# Patient Record
Sex: Female | Born: 1985 | Race: White | Hispanic: No | Marital: Married | Smoking: Former smoker
Health system: Southern US, Community
[De-identification: ages and names within clinical notes are randomized; demographics above are authoritative.]

## PROBLEM LIST (undated history)

## (undated) DIAGNOSIS — G43909 Migraine, unspecified, not intractable, without status migrainosus: Secondary | ICD-10-CM

## (undated) DIAGNOSIS — O009 Unspecified ectopic pregnancy without intrauterine pregnancy: Secondary | ICD-10-CM

## (undated) DIAGNOSIS — G9050 Complex regional pain syndrome I, unspecified: Secondary | ICD-10-CM

## (undated) DIAGNOSIS — E282 Polycystic ovarian syndrome: Secondary | ICD-10-CM

## (undated) DIAGNOSIS — I10 Essential (primary) hypertension: Secondary | ICD-10-CM

## (undated) DIAGNOSIS — R112 Nausea with vomiting, unspecified: Secondary | ICD-10-CM

## (undated) DIAGNOSIS — I7300 Raynaud's syndrome without gangrene: Secondary | ICD-10-CM

## (undated) DIAGNOSIS — Z9889 Other specified postprocedural states: Secondary | ICD-10-CM

## (undated) DIAGNOSIS — J189 Pneumonia, unspecified organism: Secondary | ICD-10-CM

## (undated) DIAGNOSIS — T8859XA Other complications of anesthesia, initial encounter: Secondary | ICD-10-CM

## (undated) DIAGNOSIS — M797 Fibromyalgia: Secondary | ICD-10-CM

## (undated) DIAGNOSIS — Z889 Allergy status to unspecified drugs, medicaments and biological substances status: Secondary | ICD-10-CM

## (undated) DIAGNOSIS — Z9189 Other specified personal risk factors, not elsewhere classified: Secondary | ICD-10-CM

## (undated) DIAGNOSIS — F419 Anxiety disorder, unspecified: Secondary | ICD-10-CM

## (undated) DIAGNOSIS — I499 Cardiac arrhythmia, unspecified: Secondary | ICD-10-CM

## (undated) DIAGNOSIS — J45909 Unspecified asthma, uncomplicated: Secondary | ICD-10-CM

## (undated) DIAGNOSIS — J449 Chronic obstructive pulmonary disease, unspecified: Secondary | ICD-10-CM

## (undated) HISTORY — PX: MEDIAL COLLATERAL LIGAMENT REPAIR, KNEE: SHX2019

## (undated) HISTORY — PX: ECTOPIC PREGNANCY SURGERY: SHX613

## (undated) HISTORY — PX: TONSILLECTOMY: SUR1361

## (undated) HISTORY — PX: CHOLECYSTECTOMY: SHX55

---

## 2009-08-12 ENCOUNTER — Emergency Department (HOSPITAL_BASED_OUTPATIENT_CLINIC_OR_DEPARTMENT_OTHER): Admission: EM | Admit: 2009-08-12 | Discharge: 2009-08-12 | Payer: Self-pay | Admitting: Emergency Medicine

## 2009-08-12 ENCOUNTER — Ambulatory Visit: Payer: Self-pay | Admitting: Diagnostic Radiology

## 2010-12-16 LAB — DIFFERENTIAL
Basophils Relative: 1 % (ref 0–1)
Eosinophils Relative: 2 % (ref 0–5)
Lymphocytes Relative: 16 % (ref 12–46)
Monocytes Absolute: 0.7 10*3/uL (ref 0.1–1.0)
Monocytes Relative: 6 % (ref 3–12)
Neutrophils Relative %: 76 % (ref 43–77)

## 2010-12-16 LAB — URINALYSIS, ROUTINE W REFLEX MICROSCOPIC
Glucose, UA: NEGATIVE mg/dL
Nitrite: NEGATIVE
Specific Gravity, Urine: 1.004 — ABNORMAL LOW (ref 1.005–1.030)
pH: 6 (ref 5.0–8.0)

## 2010-12-16 LAB — PREGNANCY, URINE: Preg Test, Ur: POSITIVE

## 2010-12-16 LAB — CBC
MCV: 87.9 fL (ref 78.0–100.0)
Platelets: 228 10*3/uL (ref 150–400)
WBC: 11 10*3/uL — ABNORMAL HIGH (ref 4.0–10.5)

## 2010-12-16 LAB — BASIC METABOLIC PANEL
BUN: 8 mg/dL (ref 6–23)
Creatinine, Ser: 0.7 mg/dL (ref 0.4–1.2)
GFR calc non Af Amer: >60 mL/min (ref >60)

## 2010-12-16 LAB — URINE MICROSCOPIC-ADD ON

## 2013-05-07 ENCOUNTER — Other Ambulatory Visit: Payer: Self-pay | Admitting: Physician Assistant

## 2013-05-08 LAB — OTHER SOLSTAS TEST
ALT: 19 U/L (ref 0–35)
HCV Ab: NEGATIVE
HIV: NONREACTIVE
Hepatitis B Surface Ag: NEGATIVE
Hepatitis B-Post: 90.6 m[IU]/mL

## 2016-11-26 ENCOUNTER — Encounter (HOSPITAL_BASED_OUTPATIENT_CLINIC_OR_DEPARTMENT_OTHER): Payer: Self-pay | Admitting: Emergency Medicine

## 2016-11-26 ENCOUNTER — Emergency Department (HOSPITAL_BASED_OUTPATIENT_CLINIC_OR_DEPARTMENT_OTHER)
Admission: EM | Admit: 2016-11-26 | Discharge: 2016-11-26 | Disposition: A | Discharge status: Home / Self Care | Payer: BLUE CROSS/BLUE SHIELD | Attending: Emergency Medicine | Admitting: Emergency Medicine

## 2016-11-26 DIAGNOSIS — Z7982 Long term (current) use of aspirin: Secondary | ICD-10-CM | POA: Insufficient documentation

## 2016-11-26 DIAGNOSIS — T7840XA Allergy, unspecified, initial encounter: Secondary | ICD-10-CM | POA: Diagnosis not present

## 2016-11-26 DIAGNOSIS — Z79899 Other long term (current) drug therapy: Secondary | ICD-10-CM | POA: Insufficient documentation

## 2016-11-26 DIAGNOSIS — F17210 Nicotine dependence, cigarettes, uncomplicated: Secondary | ICD-10-CM | POA: Diagnosis not present

## 2016-11-26 DIAGNOSIS — R21 Rash and other nonspecific skin eruption: Secondary | ICD-10-CM | POA: Diagnosis present

## 2016-11-26 HISTORY — DX: Unspecified ectopic pregnancy without intrauterine pregnancy: O00.90

## 2016-11-26 HISTORY — DX: Fibromyalgia: M79.7

## 2016-11-26 HISTORY — DX: Allergy status to unspecified drugs, medicaments and biological substances: Z88.9

## 2016-11-26 HISTORY — DX: Anxiety disorder, unspecified: F41.9

## 2016-11-26 HISTORY — DX: Other specified personal risk factors, not elsewhere classified: Z91.89

## 2016-11-26 HISTORY — DX: Migraine, unspecified, not intractable, without status migrainosus: G43.909

## 2016-11-26 HISTORY — DX: Polycystic ovarian syndrome: E28.2

## 2016-11-26 HISTORY — DX: Raynaud's syndrome without gangrene: I73.00

## 2016-11-26 MED ORDER — DIPHENHYDRAMINE HCL 25 MG PO CAPS: 50.0000 mg | ORAL_CAPSULE | Freq: Four times a day (QID) | ORAL | 0 refills | Status: DC

## 2016-11-26 MED ORDER — DIPHENHYDRAMINE HCL 25 MG PO CAPS
25.0000 mg | ORAL_CAPSULE | Freq: Once | ORAL | Status: AC
  Administered 2016-11-26: 25 mg via ORAL
  Filled 2016-11-26: qty 1

## 2016-11-26 MED ORDER — FAMOTIDINE 40 MG PO TABS: 40.0000 mg | ORAL_TABLET | Freq: Two times a day (BID) | ORAL | 0 refills | Status: DC

## 2016-11-26 MED ORDER — FAMOTIDINE 20 MG PO TABS
40.0000 mg | ORAL_TABLET | Freq: Once | ORAL | Status: AC
  Administered 2016-11-26: 40 mg via ORAL
  Filled 2016-11-26: qty 2

## 2016-11-26 NOTE — ED Notes (Signed)
Pt on cardiac monitor and auto VS 

## 2016-11-26 NOTE — ED Notes (Signed)
Confirmed with EDP re: Benadryl since pt had it PTA; EDP wants to proceed with Benadryl.

## 2016-11-26 NOTE — ED Notes (Signed)
Pt states feeling "some better" , normal speech noted, no dysphagia noted, tracheal sounds clear

## 2016-11-26 NOTE — ED Triage Notes (Signed)
Patient with hx of multiple drug allergies, started Buspar yesterday. Patient reports sudden onset hives to chest and flushing to face today. Patient was given two chewable benadryl PTA, declined transport with EMS. Patient is speaking without distress at this time.

## 2016-11-26 NOTE — ED Notes (Signed)
Pt sts she feels about the same; face is noted to be less bright red and discoloration on forehead diminished.

## 2016-11-26 NOTE — ED Provider Notes (Signed)
[facility]   CSN: [id number] Arrival date & time: 11/26/16  1523     History   Chief Complaint Chief Complaint  Patient presents with  . Allergic Reaction    HPI Joanna Zimmerman is a 31 y.o. female.  The history is provided by the patient.  Rash   This is a new problem. The current episode started 1 to 2 hours ago. The problem has not changed since onset.Associated with: new medicaiton, fast food. There has been no fever. The rash is present on the face. The pain is mild. The pain has been constant since onset. Associated symptoms include itching. She has tried antihistamines for the symptoms. The treatment provided no relief.    Past Medical History:  Diagnosis Date  . Anxiety   . Ectopic pregnancy   . Fibromyalgia   . H/O multiple allergies   . Migraines   . PCOS (polycystic ovarian syndrome)   . Raynaud disease     There are no active problems to display for this patient.   Past Surgical History:  Procedure Laterality Date  . CHOLECYSTECTOMY      OB History    No data available       Home Medications    Prior to Admission medications   Medication Sig Start Date End Date Taking? Authorizing Provider  aspirin EC 81 MG tablet Take 81 mg by mouth daily.   Yes Historical Provider, MD  atenolol (TENORMIN) 50 MG tablet Take 50 mg by mouth daily.   Yes Historical Provider, MD  busPIRone (BUSPAR) 15 MG tablet Take 15 mg by mouth 3 (three) times daily.   Yes Historical Provider, MD  cetirizine (ZYRTEC) 10 MG tablet Take 10 mg by mouth daily.   Yes Historical Provider, MD  cyclobenzaprine (FLEXERIL) 10 MG tablet Take 10 mg by mouth 3 (three) times daily as needed for muscle spasms.   Yes Historical Provider, MD  dicyclomine (BENTYL) 10 MG capsule Take 10 mg by mouth 4 (four) times daily -  before meals and at bedtime.   Yes Historical Provider, MD  DULoxetine (CYMBALTA) 30 MG capsule Take 30 mg by mouth daily.   Yes Historical Provider, MD    EPINEPHrine 0.3 mg/0.3 mL IJ SOAJ injection Inject into the muscle once.   Yes Historical Provider, MD  etonogestrel-ethinyl estradiol (NUVARING) 0.12-0.015 MG/24HR vaginal ring Place 1 each vaginally every 28 (twenty-eight) days. Insert vaginally and leave in place for 3 consecutive weeks, then remove for 1 week.   Yes Historical Provider, MD  fluticasone (FLONASE) 50 MCG/ACT nasal spray Place into both nostrils daily.   Yes Historical Provider, MD  gabapentin (NEURONTIN) 800 MG tablet Take 800 mg by mouth 3 (three) times daily.   Yes Historical Provider, MD  hydrocortisone cream 0.5 % Apply 1 application topically 2 (two) times daily.   Yes Historical Provider, MD  Ibuprofen-Famotidine (DUEXIS) 800-26.6 MG TABS Take by mouth.   Yes Historical Provider, MD  lidocaine (LIDODERM) 5 % Place 1 patch onto the skin daily. Remove & Discard patch within 12 hours or as directed by MD   Yes Historical Provider, MD  omeprazole (PRILOSEC) 40 MG capsule Take 40 mg by mouth daily.   Yes Historical Provider, MD  promethazine (PHENERGAN) 25 MG tablet Take 25 mg by mouth every 6 (six) hours as needed for nausea or vomiting.   Yes Historical Provider, MD  vitamin B-12 (CYANOCOBALAMIN) 1000 MCG tablet Take 1,000 mcg by mouth daily.   Yes  Historical Provider, MD  Botulinum Toxin Type A (BOTOX) 200 units SOLR Inject as directed.    Historical Provider, MD  ketoconazole (NIZORAL) 2 % cream Apply 1 application topically daily.    Historical Provider, MD  ondansetron (ZOFRAN) 4 MG tablet Take 4 mg by mouth every 8 (eight) hours as needed for nausea or vomiting.    Historical Provider, MD  promethazine (PHENERGAN) 25 MG suppository Place 25 mg rectally every 6 (six) hours as needed for nausea or vomiting.    Historical Provider, MD    Family History History reviewed. No pertinent family history.  Social History Social History  Substance Use Topics  . Smoking status: Current Every Day Smoker    Types: E-cigarettes   . Smokeless tobacco: Never Used  . Alcohol use No     Allergies   Cefdinir; Baclofen; Doxycycline; Levetiracetam; Levofloxacin; Nifedipine; Relafen [nabumetone]; Robaxin [methocarbamol]; and Venlafaxine   Review of Systems Review of Systems  Skin: Positive for itching and rash.  All other systems reviewed and are negative.    Physical Exam Updated Vital Signs BP 128/66 (BP Location: Right Arm)   Pulse 92 Comment: NSR  Temp 98.3 F (36.8 C) (Oral)   Resp (!) 22   Ht 5\' 7"  (1.702 m)   Wt 245 lb (111.1 kg)   SpO2 98%   BMI 38.37 kg/m   Physical Exam  Constitutional: She is oriented to person, place, and time. She appears well-developed and well-nourished. No distress.  HENT:  Head: Normocephalic.  Nose: Nose normal.  Mouth/Throat: Oropharynx is clear and moist.  Airway widely patent  Eyes: Conjunctivae are normal.  Neck: Neck supple. No tracheal deviation present.  Cardiovascular: Normal rate, regular rhythm and normal heart sounds.   Pulmonary/Chest: Effort normal and breath sounds normal. No respiratory distress.  Abdominal: Soft. She exhibits no distension.  Neurological: She is alert and oriented to person, place, and time.  Skin: Skin is warm and dry.  Mild facial flushing  Psychiatric: She has a normal mood and affect.     ED Treatments / Results  Labs (all labs ordered are listed, but only abnormal results are displayed) Labs Reviewed - No data to display  EKG  EKG Interpretation None       Radiology No results found.  Procedures Procedures (including critical care time)  Medications Ordered in ED Medications  diphenhydrAMINE (BENADRYL) capsule 25 mg (25 mg Oral Given 11/26/16 1617)  famotidine (PEPCID) tablet 40 mg (40 mg Oral Given 11/26/16 1618)     Initial Impression / Assessment and Plan / ED Course  I have reviewed the triage vital signs and the nursing notes.  Pertinent labs & imaging results that were available during my care of  the patient were reviewed by me and considered in my medical decision making (see chart for details).     31 y.o. female presents with facial flushing. Recently started buspar which she blames for her symptoms but they started after getting [employer]. Unclear trigger but appears to be mild allergic reaction. No signs of anaphylaxis. H1 and H2 blockers provided. No clinical progression. Plan to follow up with PCP as needed and return precautions discussed for worsening or new concerning symptoms.   Final Clinical Impressions(s) / ED Diagnoses   Final diagnoses:  Allergic reaction, initial encounter    New Prescriptions New Prescriptions   No medications on file     Lyndal Pulleyaniel Roann Merk, MD 11/27/16 0221

## 2017-08-25 ENCOUNTER — Emergency Department (HOSPITAL_BASED_OUTPATIENT_CLINIC_OR_DEPARTMENT_OTHER): Payer: Self-pay

## 2017-08-25 ENCOUNTER — Other Ambulatory Visit: Payer: Self-pay

## 2017-08-25 ENCOUNTER — Emergency Department (HOSPITAL_BASED_OUTPATIENT_CLINIC_OR_DEPARTMENT_OTHER)
Admission: EM | Admit: 2017-08-25 | Discharge: 2017-08-25 | Disposition: A | Discharge status: Home / Self Care | Payer: Self-pay | Attending: Physician Assistant | Admitting: Physician Assistant

## 2017-08-25 ENCOUNTER — Encounter (HOSPITAL_BASED_OUTPATIENT_CLINIC_OR_DEPARTMENT_OTHER): Payer: Self-pay | Admitting: *Deleted

## 2017-08-25 DIAGNOSIS — F17290 Nicotine dependence, other tobacco product, uncomplicated: Secondary | ICD-10-CM | POA: Insufficient documentation

## 2017-08-25 DIAGNOSIS — Z7982 Long term (current) use of aspirin: Secondary | ICD-10-CM | POA: Insufficient documentation

## 2017-08-25 DIAGNOSIS — H9201 Otalgia, right ear: Secondary | ICD-10-CM | POA: Insufficient documentation

## 2017-08-25 DIAGNOSIS — M791 Myalgia, unspecified site: Secondary | ICD-10-CM | POA: Insufficient documentation

## 2017-08-25 DIAGNOSIS — R079 Chest pain, unspecified: Secondary | ICD-10-CM | POA: Insufficient documentation

## 2017-08-25 DIAGNOSIS — J069 Acute upper respiratory infection, unspecified: Secondary | ICD-10-CM | POA: Insufficient documentation

## 2017-08-25 DIAGNOSIS — R0602 Shortness of breath: Secondary | ICD-10-CM | POA: Insufficient documentation

## 2017-08-25 DIAGNOSIS — Z79899 Other long term (current) drug therapy: Secondary | ICD-10-CM | POA: Insufficient documentation

## 2017-08-25 MED ORDER — BENZONATATE 100 MG PO CAPS: 100.0000 mg | ORAL_CAPSULE | Freq: Three times a day (TID) | ORAL | 0 refills | Status: DC

## 2017-08-25 MED ORDER — ALBUTEROL SULFATE HFA 108 (90 BASE) MCG/ACT IN AERS
1.0000 | INHALATION_SPRAY | Freq: Once | RESPIRATORY_TRACT | Status: AC
  Administered 2017-08-25: 1 via RESPIRATORY_TRACT
  Filled 2017-08-25: qty 6.7

## 2017-08-25 MED ORDER — PREDNISONE 10 MG PO TABS: 40.0000 mg | ORAL_TABLET | Freq: Every day | ORAL | 0 refills | Status: AC

## 2017-08-25 MED ORDER — AEROCHAMBER PLUS FLO-VU MEDIUM MISC
1.0000 | Freq: Once | Status: AC
  Administered 2017-08-25: 1
  Filled 2017-08-25: qty 1

## 2017-08-25 MED ORDER — FLUTICASONE PROPIONATE 50 MCG/ACT NA SUSP: 1.0000 | Freq: Every day | NASAL | 0 refills | Status: DC

## 2017-08-25 NOTE — Discharge Instructions (Signed)
You likely have a viral illness.  This should be treated symptomatically. Use Tylenol or ibuprofen as needed for fevers or body aches. Use Flonase daily for nasal congestion and cough. Take prednisone as prescribed. Use the albuterol inhaler as needed for shortness of breath and cough.  Use tessalon pereles or cough drops for cough.  Make sure you stay well-hydrated with water. Wash your hands frequently to prevent spread of infection. Follow-up with your primary care doctor in 1 week if your symptoms are not improving. Return to the emergency room if you develop chest pain, difficulty breathing, or any new or worsening symptoms.

## 2017-08-25 NOTE — ED Triage Notes (Signed)
Dry cough x 3 days. Today she feels like she is not getting a good breath. No distress at triage. Normal breathing and speaking in complete sentences.

## 2017-08-25 NOTE — ED Provider Notes (Signed)
[facility] EMERGENCY DEPARTMENT Provider Note   CSN: [id number] Arrival date & time: 08/25/17  1225     History   Chief Complaint Chief Complaint  Patient presents with  . Shortness of Breath    HPI Joanna Zimmerman is a 31 y.o. female presenting with cough and shortness of breath.  Patient states that starting on Monday, she started to feel poorly.  Tuesday, she developed a dry cough and some shortness of breath.  This has persisted throughout today.  Cough is constant, not worse at night.  Shortness of breath is worse with activity.  She reports chest pain with coughing.  She reports generalized body aches and right ear pain.  She denies fevers, reporting chills.  She denies sore throat, nausea, vomiting, abdominal pain, urinary symptoms, abnormal bowel movements.  She denies recent surgeries, travel, immobilization, history of blood clots, or history of cancer.  She states she is on oral contraceptives.  She denies leg pain or swelling.  HPI  Past Medical History:  Diagnosis Date  . Anxiety   . Ectopic pregnancy   . Fibromyalgia   . H/O multiple allergies   . Migraines   . PCOS (polycystic ovarian syndrome)   . Raynaud disease     There are no active problems to display for this patient.   Past Surgical History:  Procedure Laterality Date  . CHOLECYSTECTOMY      OB History    No data available       Home Medications    Prior to Admission medications   Medication Sig Start Date End Date Taking? Authorizing Provider  aspirin EC 81 MG tablet Take 81 mg by mouth daily.    [provider]  atenolol (TENORMIN) 50 MG tablet Take 50 mg by mouth daily.    [provider]  benzonatate (TESSALON) 100 MG capsule Take 1 capsule (100 mg total) by mouth every 8 (eight) hours. 08/25/17   Jara Feider, PA-C  Botulinum Toxin Type A (BOTOX) 200 units SOLR Inject as directed.    [provider]  busPIRone (BUSPAR) 15 MG tablet  Take 15 mg by mouth 3 (three) times daily.    [provider]  cetirizine (ZYRTEC) 10 MG tablet Take 10 mg by mouth daily.    [provider]  cyclobenzaprine (FLEXERIL) 10 MG tablet Take 10 mg by mouth 3 (three) times daily as needed for muscle spasms.    [provider]  dicyclomine (BENTYL) 10 MG capsule Take 10 mg by mouth 4 (four) times daily -  before meals and at bedtime.    [provider]  diphenhydrAMINE (BENADRYL) 25 mg capsule Take 2 capsules (50 mg total) by mouth every 6 (six) hours. 11/26/16 11/30/16  Lyndal PulleyKnott, Daniel, MD  DULoxetine (CYMBALTA) 30 MG capsule Take 30 mg by mouth daily.    [provider]  EPINEPHrine 0.3 mg/0.3 mL IJ SOAJ injection Inject into the muscle once.    [provider]  etonogestrel-ethinyl estradiol (NUVARING) 0.12-0.015 MG/24HR vaginal ring Place 1 each vaginally every 28 (twenty-eight) days. Insert vaginally and leave in place for 3 consecutive weeks, then remove for 1 week.    [provider]  famotidine (PEPCID) 40 MG tablet Take 1 tablet (40 mg total) by mouth 2 (two) times daily. 11/26/16 11/30/16  Lyndal PulleyKnott, Daniel, MD  fluticasone (FLONASE) 50 MCG/ACT nasal spray Place 1 spray into both nostrils daily. 08/25/17   Maniya Donovan, PA-C  gabapentin (NEURONTIN) 800 MG tablet Take 800  mg by mouth 3 (three) times daily.    [provider]  hydrocortisone cream 0.5 % Apply 1 application topically 2 (two) times daily.    [provider]  Ibuprofen-Famotidine (DUEXIS) 800-26.6 MG TABS Take by mouth.    [provider]  ketoconazole (NIZORAL) 2 % cream Apply 1 application topically daily.    [provider]  lidocaine (LIDODERM) 5 % Place 1 patch onto the skin daily. Remove & Discard patch within 12 hours or as directed by MD    [provider]  omeprazole (PRILOSEC) 40 MG capsule Take 40 mg by mouth daily.    [provider]  ondansetron (ZOFRAN) 4 MG  tablet Take 4 mg by mouth every 8 (eight) hours as needed for nausea or vomiting.    [provider]  predniSONE (DELTASONE) 10 MG tablet Take 4 tablets (40 mg total) by mouth daily for 5 days. 08/25/17 08/30/17  Stepfon Rawles, PA-C  promethazine (PHENERGAN) 25 MG suppository Place 25 mg rectally every 6 (six) hours as needed for nausea or vomiting.    [provider]  promethazine (PHENERGAN) 25 MG tablet Take 25 mg by mouth every 6 (six) hours as needed for nausea or vomiting.    [provider]  vitamin B-12 (CYANOCOBALAMIN) 1000 MCG tablet Take 1,000 mcg by mouth daily.    [provider]    Family History No family history on file.  Social History Social History   Tobacco Use  . Smoking status: Current Every Day Smoker    Types: E-cigarettes  . Smokeless tobacco: Never Used  Substance Use Topics  . Alcohol use: No  . Drug use: No     Allergies   Cefdinir; Baclofen; Doxycycline; Levetiracetam; Levofloxacin; Nifedipine; Relafen [nabumetone]; Robaxin [methocarbamol]; and Venlafaxine   Review of Systems Review of Systems  Constitutional: Positive for chills. Negative for fever.  HENT: Negative for congestion and sore throat.   Respiratory: Positive for cough and shortness of breath.      Physical Exam Updated Vital Signs BP 139/81   Pulse 80   Temp 98.1 F (36.7 C) (Oral)   Resp 18   Ht 5\' 7"  (1.702 m)   Wt 99.8 kg (220 lb)   SpO2 99%   BMI 34.46 kg/m   Physical Exam  Constitutional: She is oriented to person, place, and time. She appears well-developed and well-nourished. No distress.  HENT:  Head: Normocephalic and atraumatic.  Right Ear: Tympanic membrane, external ear and ear canal normal.  Left Ear: Tympanic membrane, external ear and ear canal normal.  Nose: Mucosal edema present. Right sinus exhibits no maxillary sinus tenderness and no frontal sinus tenderness. Left sinus exhibits no maxillary sinus tenderness and  no frontal sinus tenderness.  Mouth/Throat: Uvula is midline, oropharynx is clear and moist and mucous membranes are normal. No tonsillar exudate.  Nasal mucosal edema.  OP clear without tonsillar swelling or exudate. No TTP of the sinuses  Eyes: Conjunctivae and EOM are normal. Pupils are equal, round, and reactive to light.  Neck: Normal range of motion.  Cardiovascular: Normal rate, regular rhythm and intact distal pulses.  Pulmonary/Chest: Effort normal and breath sounds normal. She has no decreased breath sounds. She has no wheezes. She has no rhonchi. She has no rales. She exhibits tenderness.  Pt speaking in full sentences without difficulty. Clear lung sounds in all fields. TTP of the anterior chest wall.   Abdominal: Soft. She exhibits no distension. There is no tenderness.  Musculoskeletal: Normal range of motion.  No leg pain or swelling. Pedal pulses intact bilaterally.  Lymphadenopathy:    She has no cervical adenopathy.  Neurological: She is alert and oriented to person, place, and time.  Skin: Skin is warm.  Psychiatric: She has a normal mood and affect.  Nursing note and vitals reviewed.    ED Treatments / Results  Labs (all labs ordered are listed, but only abnormal results are displayed) Labs Reviewed - No data to display  EKG  EKG Interpretation None       Radiology Dg Chest 2 View  Result Date: 08/25/2017 CLINICAL DATA:  Shortness of breath, cough EXAM: CHEST  2 VIEW COMPARISON:  Thoracic spine films of 03/03/2016 FINDINGS: No pneumonia or effusion is seen. Mediastinal and hilar contours are unremarkable. The heart is within normal limits in size with probable mild pectus present. No acute bony abnormality is seen. IMPRESSION: No active cardiopulmonary disease. question mild pectus deformity. Electronically Signed   By: Dwyane Dee M.D.   On: 08/25/2017 12:56    Procedures Procedures (including critical care time)  Medications Ordered in ED Medications    albuterol (PROVENTIL HFA;VENTOLIN HFA) 108 (90 Base) MCG/ACT inhaler 1 puff (1 puff Inhalation Given 08/25/17 1345)  AEROCHAMBER PLUS FLO-VU MEDIUM MISC 1 each (1 each Other Given 08/25/17 1345)     Initial Impression / Assessment and Plan / ED Course  I have reviewed the triage vital signs and the nursing notes.  Pertinent labs & imaging results that were available during my care of the patient were reviewed by me and considered in my medical decision making (see chart for details).     Patient presenting with URI symptoms.  Physical exam reassuring, patient is afebrile and appears nontoxic.  Pulmonary exam reassuring.  Doubt pneumonia, strep, other bacterial infection, or peritonsillar abscess. Doubt PE, Wells score indicates low risk. CXR negative for infection. Likely URI.  Will treat symptomatically.  Patient to follow-up with primary care as needed.  At this time, patient appears safe for discharge.  Return precautions given.  Patient states she understands and agrees to plan.    Final Clinical Impressions(s) / ED Diagnoses   Final diagnoses:  Upper respiratory tract infection, unspecified type    ED Discharge Orders        Ordered    predniSONE (DELTASONE) 10 MG tablet  Daily     08/25/17 1338    fluticasone (FLONASE) 50 MCG/ACT nasal spray  Daily     08/25/17 1338    benzonatate (TESSALON) 100 MG capsule  Every 8 hours     08/25/17 1338       Vinh Sachs, PA-C 08/25/17 1719    Mackuen, Cindee Salt, MD 08/29/17 0002

## 2018-06-01 ENCOUNTER — Emergency Department (INDEPENDENT_AMBULATORY_CARE_PROVIDER_SITE_OTHER): Payer: BC Managed Care – PPO

## 2018-06-01 ENCOUNTER — Encounter: Payer: Self-pay | Admitting: *Deleted

## 2018-06-01 ENCOUNTER — Emergency Department (INDEPENDENT_AMBULATORY_CARE_PROVIDER_SITE_OTHER)
Admission: EM | Admit: 2018-06-01 | Discharge: 2018-06-01 | Disposition: A | Discharge status: Home / Self Care | Payer: BC Managed Care – PPO | Source: Home / Self Care | Attending: Family Medicine | Admitting: Family Medicine

## 2018-06-01 DIAGNOSIS — M5412 Radiculopathy, cervical region: Secondary | ICD-10-CM

## 2018-06-01 DIAGNOSIS — M62838 Other muscle spasm: Secondary | ICD-10-CM | POA: Diagnosis not present

## 2018-06-01 MED ORDER — HYDROCODONE-ACETAMINOPHEN 5-325 MG PO TABS: 1.0000 | ORAL_TABLET | Freq: Four times a day (QID) | ORAL | 0 refills | Status: DC | PRN

## 2018-06-01 MED ORDER — PREDNISONE 20 MG PO TABS: ORAL_TABLET | ORAL | 0 refills | Status: DC

## 2018-06-01 MED ORDER — CYCLOBENZAPRINE HCL 10 MG PO TABS: 10.0000 mg | ORAL_TABLET | Freq: Every day | ORAL | 1 refills | Status: DC

## 2018-06-01 NOTE — ED Triage Notes (Signed)
Pt awoke with left sided neck and shoulder pain x1 week ago. Pain has persisted throughout week. Denies injury.

## 2018-06-01 NOTE — Discharge Instructions (Addendum)
Apply ice pack for 20 to 30 minutes, 3 to 4 times daily  Continue until pain and swelling decrease.  Begin range of motion and stretching exercises as tolerated. 

## 2018-06-01 NOTE — ED Provider Notes (Signed)
[facility]    CSN: [id number] Arrival date & time: 06/01/18  0940     History   Chief Complaint Chief Complaint  Patient presents with  . Shoulder Pain    HPI Joanna Zimmerman is a 32 y.o. female.   Patient awoke 6 days ago with pain in her left neck that has persisted. She denies extremity weakness or paresthesias.  The pain radiates to her left scapula.  She recalls no injury.  The history is provided by the patient.    Past Medical History:  Diagnosis Date  . Anxiety   . Ectopic pregnancy   . Fibromyalgia   . H/O multiple allergies   . Migraines   . PCOS (polycystic ovarian syndrome)   . Raynaud disease     There are no active problems to display for this patient.   Past Surgical History:  Procedure Laterality Date  . CHOLECYSTECTOMY    . ECTOPIC PREGNANCY SURGERY      OB History   None      Home Medications    Prior to Admission medications   Medication Sig Start Date End Date Taking? Authorizing Provider  aspirin EC 81 MG tablet Take 81 mg by mouth daily.    [provider]  atenolol (TENORMIN) 50 MG tablet Take 50 mg by mouth daily.    [provider]  benzonatate (TESSALON) 100 MG capsule Take 1 capsule (100 mg total) by mouth every 8 (eight) hours. 08/25/17   Caccavale, Sophia, PA-C  Botulinum Toxin Type A (BOTOX) 200 units SOLR Inject as directed.    [provider]  busPIRone (BUSPAR) 15 MG tablet Take 15 mg by mouth 3 (three) times daily.    [provider]  cetirizine (ZYRTEC) 10 MG tablet Take 10 mg by mouth daily.    [provider]  cyclobenzaprine (FLEXERIL) 10 MG tablet Take 1 tablet (10 mg total) by mouth at bedtime. 06/01/18   Lattie Haw, MD  dicyclomine (BENTYL) 10 MG capsule Take 10 mg by mouth 4 (four) times daily -  before meals and at bedtime.    [provider]  diphenhydrAMINE (BENADRYL) 25 mg capsule Take 2 capsules (50 mg total) by mouth every 6 (six)  hours. 11/26/16 11/30/16  Lyndal Pulley, MD  DULoxetine (CYMBALTA) 30 MG capsule Take 30 mg by mouth daily.    [provider]  EPINEPHrine 0.3 mg/0.3 mL IJ SOAJ injection Inject into the muscle once.    [provider]  etonogestrel-ethinyl estradiol (NUVARING) 0.12-0.015 MG/24HR vaginal ring Place 1 each vaginally every 28 (twenty-eight) days. Insert vaginally and leave in place for 3 consecutive weeks, then remove for 1 week.    [provider]  famotidine (PEPCID) 40 MG tablet Take 1 tablet (40 mg total) by mouth 2 (two) times daily. 11/26/16 11/30/16  Lyndal Pulley, MD  fluticasone (FLONASE) 50 MCG/ACT nasal spray Place 1 spray into both nostrils daily. 08/25/17   Caccavale, Sophia, PA-C  gabapentin (NEURONTIN) 800 MG tablet Take 800 mg by mouth 3 (three) times daily.    [provider]  HYDROcodone-acetaminophen (NORCO/VICODIN) 5-325 MG tablet Take 1 tablet by mouth every 6 (six) hours as needed for moderate pain. 06/01/18   Lattie Haw, MD  hydrocortisone cream 0.5 % Apply 1 application topically 2 (two) times daily.    [provider]  Ibuprofen-Famotidine (DUEXIS) 800-26.6 MG TABS Take by mouth.    [provider]  ketoconazole (NIZORAL) 2 % cream Apply  1 application topically daily.    [provider]  lidocaine (LIDODERM) 5 % Place 1 patch onto the skin daily. Remove & Discard patch within 12 hours or as directed by MD    [provider]  omeprazole (PRILOSEC) 40 MG capsule Take 40 mg by mouth daily.    [provider]  ondansetron (ZOFRAN) 4 MG tablet Take 4 mg by mouth every 8 (eight) hours as needed for nausea or vomiting.    [provider]  predniSONE (DELTASONE) 20 MG tablet Take one tab by mouth twice daily for 4 days, then one daily for 3 days. Take with food. 06/01/18   Lattie Haw, MD  promethazine (PHENERGAN) 25 MG suppository Place 25 mg rectally every 6 (six) hours as needed for nausea  or vomiting.    [provider]  promethazine (PHENERGAN) 25 MG tablet Take 25 mg by mouth every 6 (six) hours as needed for nausea or vomiting.    [provider]  vitamin B-12 (CYANOCOBALAMIN) 1000 MCG tablet Take 1,000 mcg by mouth daily.    [provider]    Family History History reviewed. No pertinent family history.  Social History Social History   Tobacco Use  . Smoking status: Current Every Day Smoker    Types: E-cigarettes  . Smokeless tobacco: Never Used  Substance Use Topics  . Alcohol use: No  . Drug use: No     Allergies   Cefdinir; Penicillins; Baclofen; Doxycycline; Levetiracetam; Levofloxacin; Nifedipine; Relafen [nabumetone]; Robaxin [methocarbamol]; and Venlafaxine   Review of Systems Review of Systems  Constitutional: Negative.   HENT: Negative.   Eyes: Negative.   Respiratory: Negative.   Cardiovascular: Negative.   Gastrointestinal: Negative.   Genitourinary: Negative.   Musculoskeletal: Positive for neck pain.  Skin: Negative.   Neurological: Negative for numbness and headaches.     Physical Exam Triage Vital Signs ED Triage Vitals  Enc Vitals Group     BP 06/01/18 1000 131/84     Pulse Rate 06/01/18 1000 83     Resp 06/01/18 1000 16     Temp 06/01/18 1000 98.3 F (36.8 C)     Temp Source 06/01/18 1000 Oral     SpO2 06/01/18 1000 97 %     Weight --      Height 06/01/18 1001 5\' 7"  (1.702 m)     Head Circumference --      Peak Flow --      Pain Score 06/01/18 1001 4     Pain Loc --      Pain Edu? --      Excl. in GC? --    No data found.  Updated Vital Signs BP 131/84 (BP Location: Right Arm)   Pulse 83   Temp 98.3 F (36.8 C) (Oral)   Resp 16   Ht 5\' 7"  (1.702 m)   SpO2 97%   BMI 34.46 kg/m   Visual Acuity Right Eye Distance:   Left Eye Distance:   Bilateral Distance:    Right Eye Near:   Left Eye Near:    Bilateral Near:     Physical Exam  Constitutional: She appears well-developed  and well-nourished. No distress.  HENT:  Head: Normocephalic.  Right Ear: External ear normal.  Left Ear: External ear normal.  Nose: Nose normal.  Mouth/Throat: Oropharynx is clear and moist.  Eyes: Pupils are equal, round, and reactive to light. Conjunctivae are normal.  Neck: Neck supple. Spinous process tenderness and muscular tenderness  present. No neck rigidity. Decreased range of motion present.    Neck reveal midline posterior tenderness to palpation.  There is tenderness to palpation over the left sternocleidomastoid muscle and trapezius muscle, extending to the left rhomboid muscles.   She has pain with rotation and flexion of her head to the left.   Distal neurovascular function is intact.   Cardiovascular: Normal heart sounds.  Pulmonary/Chest: Breath sounds normal.  Musculoskeletal: She exhibits no edema.       Back:  There is distinct tenderness over medial edge of left scapula.  Pain elicited by resisted abduction of left shoulder while palpating left rhomboid muscles.   Lymphadenopathy:    She has no cervical adenopathy.  Neurological: She is alert.  Skin: Skin is warm and dry.  Nursing note and vitals reviewed.    UC Treatments / Results  Labs (all labs ordered are listed, but only abnormal results are displayed) Labs Reviewed - No data to display  EKG None  Radiology Dg Cervical Spine Complete  Result Date: 06/01/2018 CLINICAL DATA:  Left-sided neck pain radiating to the left scapula, 1 week duration. EXAM: CERVICAL SPINE - COMPLETE 4+ VIEW COMPARISON:  03/03/2016 FINDINGS: Straightening of the normal cervical lordosis. No significant disc space narrowing. No evidence of facet arthropathy. No osteophytic encroachment upon the canal or foramina. IMPRESSION: Straightening of the normal cervical lordosis. Otherwise unremarkable images. No sign of disc space narrowing or osteophytic encroachment upon the canal or foramina. Electronically Signed   By: Paulina FusiMark  Shogry  M.D.   On: 06/01/2018 11:08    Procedures Procedures (including critical care time)  Medications Ordered in UC Medications - No data to display  Initial Impression / Assessment and Plan / UC Course  I have reviewed the triage vital signs and the nursing notes.  Pertinent labs & imaging results that were available during my care of the patient were reviewed by me and considered in my medical decision making (see chart for details).    Note straightening of cervical lordosis on C-spine X-ray. Begin prednisone burst/taper, Flexeril and Lortab (#12, no refill) at bedtime. Controlled Substance Prescriptions I have consulted the [place] Controlled Substances Registry for this patient, and feel the risk/benefit ratio today is favorable for proceeding with this prescription for a controlled substance.  Followup with Dr. Rodney Langtonhomas Thekkekandam or Dr. Clementeen GrahamEvan [facility] ([facility]) if not improving about two weeks.     Final Clinical Impressions(s) / UC Diagnoses   Final diagnoses:  Neck muscle spasm     Discharge Instructions     Apply ice pack for 20 to 30 minutes, 3 to 4 times daily  Continue until pain and swelling decrease. Begin range of motion and stretching exercises as tolerated.    ED Prescriptions    Medication Sig Dispense Auth. Provider   predniSONE (DELTASONE) 20 MG tablet Take one tab by mouth twice daily for 4 days, then one daily for 3 days. Take with food. 11 tablet Lattie HawBeese, Stephen A, MD   cyclobenzaprine (FLEXERIL) 10 MG tablet Take 1 tablet (10 mg total) by mouth at bedtime. 10 tablet Lattie HawBeese, Stephen A, MD   HYDROcodone-acetaminophen (NORCO/VICODIN) 5-325 MG tablet Take 1 tablet by mouth every 6 (six) hours as needed for moderate pain. 12 tablet Lattie HawBeese, Stephen A, MD         Lattie HawBeese, Stephen A, MD 06/01/18 1140

## 2018-06-30 ENCOUNTER — Emergency Department
Admission: EM | Admit: 2018-06-30 | Discharge: 2018-06-30 | Disposition: A | Discharge status: Home / Self Care | Payer: BC Managed Care – PPO | Source: Home / Self Care | Attending: Family Medicine | Admitting: Family Medicine

## 2018-06-30 ENCOUNTER — Other Ambulatory Visit: Payer: Self-pay

## 2018-06-30 ENCOUNTER — Emergency Department (INDEPENDENT_AMBULATORY_CARE_PROVIDER_SITE_OTHER): Payer: BC Managed Care – PPO

## 2018-06-30 DIAGNOSIS — R112 Nausea with vomiting, unspecified: Secondary | ICD-10-CM

## 2018-06-30 DIAGNOSIS — R197 Diarrhea, unspecified: Secondary | ICD-10-CM

## 2018-06-30 DIAGNOSIS — R1031 Right lower quadrant pain: Secondary | ICD-10-CM | POA: Diagnosis not present

## 2018-06-30 DIAGNOSIS — R1030 Lower abdominal pain, unspecified: Secondary | ICD-10-CM | POA: Diagnosis not present

## 2018-06-30 DIAGNOSIS — N83202 Unspecified ovarian cyst, left side: Secondary | ICD-10-CM

## 2018-06-30 DIAGNOSIS — N83201 Unspecified ovarian cyst, right side: Secondary | ICD-10-CM

## 2018-06-30 LAB — POCT URINALYSIS DIP (MANUAL ENTRY)
BILIRUBIN UA: NEGATIVE
BILIRUBIN UA: NEGATIVE mg/dL
Blood, UA: NEGATIVE
GLUCOSE UA: NEGATIVE mg/dL
Leukocytes, UA: NEGATIVE
NITRITE UA: NEGATIVE
SPEC GRAV UA: 1.02 (ref 1.010–1.025)
Urobilinogen, UA: 0.2 E.U./dL
pH, UA: 7 (ref 5.0–8.0)

## 2018-06-30 LAB — POCT CBC W AUTO DIFF (K'VILLE URGENT CARE)

## 2018-06-30 MED ORDER — AZITHROMYCIN 250 MG PO TABS: 250.0000 mg | ORAL_TABLET | Freq: Every day | ORAL | 0 refills | Status: DC

## 2018-06-30 MED ORDER — ONDANSETRON 4 MG PO TBDP
4.0000 mg | ORAL_TABLET | Freq: Once | ORAL | Status: AC
  Administered 2018-06-30: 4 mg via ORAL

## 2018-06-30 MED ORDER — IOPAMIDOL (ISOVUE-300) INJECTION 61%
100.0000 mL | Freq: Once | INTRAVENOUS | Status: AC | PRN
  Administered 2018-06-30: 100 mL via INTRAVENOUS

## 2018-06-30 MED ORDER — PROMETHAZINE HCL 25 MG PO TABS: 25.0000 mg | ORAL_TABLET | Freq: Four times a day (QID) | ORAL | 0 refills | Status: DC | PRN

## 2018-06-30 NOTE — ED Provider Notes (Signed)
[facility]    CSN: [id number] Arrival date & time: 06/30/18  0816     History   Chief Complaint Chief Complaint  Patient presents with  . Abdominal Pain    diarrhea  . Nausea    HPI Joanna Zimmerman is a 32 y.o. female.   HPI Joanna Zimmerman is a 32 y.o. female presenting to UC with c/o diarrhea for the last 2 weeks.  Last night she developed nausea and vomiting, about 6 episodes. Associated lower abdominal pain, worse in the RLQ.  Pt reports "burning" in her abdomen.  Hx of UTIs. Denies fever but has had chills. No dysuria but is concerned about UTIs.  Hx of UTIs in the past. 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 No recent antibiotic use. No hx of diverticulitis.    Past Medical History:  Diagnosis Date  . Anxiety   . Ectopic pregnancy   . Fibromyalgia   . H/O multiple allergies   . Migraines   . PCOS (polycystic ovarian syndrome)   . Raynaud disease     There are no active problems to display for this patient.   Past Surgical History:  Procedure Laterality Date  . CHOLECYSTECTOMY    . ECTOPIC PREGNANCY SURGERY      OB History   None      Home Medications    Prior to Admission medications   Medication Sig Start Date End Date Taking? Authorizing Provider  aspirin EC 81 MG tablet Take 81 mg by mouth daily.    [provider]  atenolol (TENORMIN) 50 MG tablet Take 50 mg by mouth daily.    [provider]  azithromycin (ZITHROMAX) 250 MG tablet Take 1 tablet (250 mg total) by mouth daily. Take first 2 tablets together, then 1 every day until finished. 06/30/18   Lurene Shadow, PA-C  benzonatate (TESSALON) 100 MG capsule Take 1 capsule (100 mg total) by mouth every 8 (eight) hours. 08/25/17   Caccavale, Sophia, PA-C  Botulinum Toxin Type A (BOTOX) 200 units SOLR Inject as directed.    [provider]    busPIRone (BUSPAR) 15 MG tablet Take 15 mg by mouth 3 (three) times daily.    [provider]  cetirizine (ZYRTEC) 10 MG tablet Take 10 mg by mouth daily.    [provider]  cyclobenzaprine (FLEXERIL) 10 MG tablet Take 1 tablet (10 mg total) by mouth at bedtime. 06/01/18   Lattie Haw, MD  dicyclomine (BENTYL) 10 MG capsule Take 10 mg by mouth 4 (four) times daily -  before meals and at bedtime.    [provider]  diphenhydrAMINE (BENADRYL) 25 mg capsule Take 2 capsules (50 mg total) by mouth every 6 (six) hours. 11/26/16 11/30/16  Lyndal Pulley, MD  DULoxetine (CYMBALTA) 30 MG capsule Take 30 mg by mouth daily.    [provider]  EPINEPHrine 0.3 mg/0.3 mL IJ SOAJ injection Inject into the muscle once.    [provider]  etonogestrel-ethinyl estradiol (NUVARING) 0.12-0.015 MG/24HR vaginal ring Place 1 each vaginally every 28 (twenty-eight) days. Insert vaginally and leave in place for 3 consecutive weeks, then remove for 1 week.    [provider]  famotidine (PEPCID) 40 MG tablet Take 1 tablet (40 mg total) by mouth 2 (two) times daily. 11/26/16 11/30/16  Lyndal Pulley, MD  fluticasone (  FLONASE) 50 MCG/ACT nasal spray Place 1 spray into both nostrils daily. 08/25/17   Caccavale, Sophia, PA-C  gabapentin (NEURONTIN) 800 MG tablet Take 800 mg by mouth 3 (three) times daily.    [provider]  HYDROcodone-acetaminophen (NORCO/VICODIN) 5-325 MG tablet Take 1 tablet by mouth every 6 (six) hours as needed for moderate pain. 06/01/18   Lattie Haw, MD  hydrocortisone cream 0.5 % Apply 1 application topically 2 (two) times daily.    [provider]  Ibuprofen-Famotidine (DUEXIS) 800-26.6 MG TABS Take by mouth.    [provider]  ketoconazole (NIZORAL) 2 % cream Apply 1 application topically daily.    [provider]  lidocaine (LIDODERM) 5 % Place 1 patch onto the skin daily. Remove & Discard patch within 12  hours or as directed by MD    [provider]  omeprazole (PRILOSEC) 40 MG capsule Take 40 mg by mouth daily.    [provider]  ondansetron (ZOFRAN) 4 MG tablet Take 4 mg by mouth every 8 (eight) hours as needed for nausea or vomiting.    [provider]  predniSONE (DELTASONE) 20 MG tablet Take one tab by mouth twice daily for 4 days, then one daily for 3 days. Take with food. 06/01/18   Lattie Haw, MD  promethazine (PHENERGAN) 25 MG tablet Take 1 tablet (25 mg total) by mouth every 6 (six) hours as needed. 06/30/18   Lurene Shadow, PA-C  vitamin B-12 (CYANOCOBALAMIN) 1000 MCG tablet Take 1,000 mcg by mouth daily.    [provider]    Family History History reviewed. No pertinent family history.  Social History Social History   Tobacco Use  . Smoking status: Current Every Day Smoker    Types: E-cigarettes  . Smokeless tobacco: Never Used  Substance Use Topics  . Alcohol use: No  . Drug use: No     Allergies   Cefdinir; Penicillins; Baclofen; Doxycycline; Levetiracetam; Levofloxacin; Nifedipine; Relafen [nabumetone]; Robaxin [methocarbamol]; and Venlafaxine   Review of Systems Review of Systems  Constitutional: Positive for appetite change and chills. Negative for fatigue and fever.  Cardiovascular: Negative for chest pain and palpitations.  Gastrointestinal: Positive for abdominal pain, diarrhea, nausea and vomiting. Negative for blood in stool and constipation.  Genitourinary: Negative for dysuria, frequency and hematuria.  Musculoskeletal: Positive for back pain. Negative for myalgias.  Neurological: Negative for dizziness, light-headedness and headaches.     Physical Exam Triage Vital Signs ED Triage Vitals  Enc Vitals Group     BP 06/30/18 0835 138/81     Pulse Rate 06/30/18 0835 94     Resp --      Temp 06/30/18 0835 97.7 F (36.5 C)     Temp Source 06/30/18 0835 Oral     SpO2 06/30/18 0835 97 %     Weight 06/30/18  0836 224 lb (101.6 kg)     Height 06/30/18 0836 5\' 8"  (1.727 m)     Head Circumference --      Peak Flow --      Pain Score 06/30/18 0836 3     Pain Loc --      Pain Edu? --      Excl. in GC? --    No data found.  Updated Vital Signs BP 138/81 (BP Location: Right Arm)   Pulse 94   Temp 97.7 F (36.5 C) (Oral)   Ht 5\' 8"  (1.727 m)   Wt 224 lb (101.6 kg)  LMP 06/20/2018 (Approximate)   SpO2 97%   BMI 34.06 kg/m   Visual Acuity Right Eye Distance:   Left Eye Distance:   Bilateral Distance:    Right Eye Near:   Left Eye Near:    Bilateral Near:     Physical Exam  Constitutional: She is oriented to person, place, and time. She appears well-developed and well-nourished.  Non-toxic appearance. She does not appear ill. No distress.  HENT:  Head: Normocephalic and atraumatic.  Mouth/Throat: Oropharynx is clear and moist.  Eyes: EOM are normal.  Neck: Normal range of motion.  Cardiovascular: Normal rate and regular rhythm.  Pulmonary/Chest: Effort normal and breath sounds normal.  Abdominal: Normal appearance. There is tenderness in the right lower quadrant, suprapubic area and left lower quadrant. There is no CVA tenderness.  Musculoskeletal: Normal range of motion.  Neurological: She is alert and oriented to person, place, and time.  Skin: Skin is warm and dry.  Psychiatric: She has a normal mood and affect. Her behavior is normal.  Nursing note and vitals reviewed.    UC Treatments / Results  Labs (all labs ordered are listed, but only abnormal results are displayed) Labs Reviewed  POCT URINALYSIS DIP (MANUAL ENTRY) - Abnormal; Notable for the following components:      Result Value   Protein Ur, POC trace (*)    All other components within normal limits  COMPLETE METABOLIC PANEL WITH GFR  LIPASE  GASTROINTESTINAL PATHOGEN PANEL PCR  POCT CBC W AUTO DIFF ([facility])    EKG None  Radiology Ct Abdomen Pelvis W Contrast  Result Date:  06/30/2018 CLINICAL DATA:  Right lower quadrant pain with nausea and vomiting EXAM: CT ABDOMEN AND PELVIS WITH CONTRAST TECHNIQUE: Multidetector CT imaging of the abdomen and pelvis was performed using the standard protocol following bolus administration of intravenous contrast. CONTRAST:  ISOVUE-300 IOPAMIDOL (ISOVUE-300) INJECTION 61% COMPARISON:  None. FINDINGS: Lower chest: No acute abnormality. Hepatobiliary: No focal liver abnormality is seen. Status post cholecystectomy. No biliary dilatation. Pancreas: Unremarkable. No pancreatic ductal dilatation or surrounding inflammatory changes. Spleen: Normal in size without focal abnormality. Adrenals/Urinary Tract: Adrenal glands are unremarkable. Kidneys are normal, without renal calculi, focal lesion, or hydronephrosis. Bladder is unremarkable. Stomach/Bowel: Stomach is within normal limits. Appendix appears normal. No evidence of bowel wall thickening, distention, or inflammatory changes. Vascular/Lymphatic: No significant vascular findings are present. No enlarged abdominal or pelvic lymph nodes. Reproductive: Uterus is within normal limits. Bilateral ovarian cystic changes are seen slightly more prominent on the left than the right. The largest of these measures 3.3 cm. Other: No abdominal wall hernia or abnormality. No abdominopelvic ascites. Musculoskeletal: No acute or significant osseous findings. IMPRESSION: Ovarian cystic changes without evidence of cyst rupture. No other focal abnormality is noted. The appendix is within normal limits. Electronically Signed   By: Alcide Clever M.D.   On: 06/30/2018 11:27    Procedures Procedures (including critical care time)  Medications Ordered in UC Medications  ondansetron (ZOFRAN-ODT) disintegrating tablet 4 mg (4 mg Oral Given 06/30/18 1610)    Initial Impression / Assessment and Plan / UC Course  I have reviewed the triage vital signs and the nursing notes.  Pertinent labs & imaging results that  were available during my care of the patient were reviewed by me and considered in my medical decision making (see chart for details).     Reassured pt of no appendicitis or diverticulitis on CT scan. Discussed ovarian cysts noted on CT,  pt was area she has had cysts in the past. Will send stool sample for testing given duration of symptoms and possible exposure to C. Diff at work. Encouraged symptomatic treatment at this time. Work note provided for today through Monday 07/03/18 if needed.  Final Clinical Impressions(s) / UC Diagnoses   Final diagnoses:  Lower abdominal pain  Diarrhea, unspecified type  Bilateral ovarian cysts  Nausea and vomiting in adult     Discharge Instructions      Be sure to get a lot of rest and stay well hydrated with sports drinks, water, diluted juices, and clear sodas.  Avoid fried fatty food, spicy food, and milk as these foods can cause worsening stomach upset.  Please follow up with family medicine in 2-3 days if not improving.  Call 911 or go to the hospital if symptoms worsening- worsening abdominal pain, unable to keep down fluids despite taking phenergan, dizziness/passing out or other new concerning symptoms develop.    ED Prescriptions    Medication Sig Dispense Auth. Provider   promethazine (PHENERGAN) 25 MG tablet Take 1 tablet (25 mg total) by mouth every 6 (six) hours as needed. 10 tablet Waylan Rocher O, PA-C   azithromycin (ZITHROMAX) 250 MG tablet Take 1 tablet (250 mg total) by mouth daily. Take first 2 tablets together, then 1 every day until finished. 6 tablet Lurene Shadow, PA-C     Controlled Substance Prescriptions [place] Controlled Substance Registry consulted? Not Applicable   Rolla Plate 06/30/18 1643

## 2018-06-30 NOTE — Discharge Instructions (Signed)
°  Be sure to get a lot of rest and stay well hydrated with sports drinks, water, diluted juices, and clear sodas.  Avoid fried fatty food, spicy food, and milk as these foods can cause worsening stomach upset.  Please follow up with family medicine in 2-3 days if not improving.  Call 911 or go to the hospital if symptoms worsening- worsening abdominal pain, unable to keep down fluids despite taking phenergan, dizziness/passing out or other new concerning symptoms develop.

## 2018-06-30 NOTE — ED Triage Notes (Signed)
Pt c/o diarrhea x 2 weeks. Nausea and vomiting all night last night and this am. Also concerned for bladder infection. Taking pepto prn.

## 2018-07-01 ENCOUNTER — Telehealth: Payer: Self-pay | Admitting: Emergency Medicine

## 2018-07-01 LAB — COMPLETE METABOLIC PANEL WITH GFR
AG Ratio: 2.2 (calc) (ref 1.0–2.5)
ALT: 19 U/L (ref 6–29)
AST: 15 U/L (ref 10–30)
Albumin: 4.8 g/dL (ref 3.6–5.1)
Alkaline phosphatase (APISO): 68 U/L (ref 33–115)
BUN: 9 mg/dL (ref 7–25)
CO2: 24 mmol/L (ref 20–32)
Calcium: 10 mg/dL (ref 8.6–10.2)
Chloride: 106 mmol/L (ref 98–110)
Creat: 0.86 mg/dL (ref 0.50–1.10)
GFR, Est African American: 104 mL/min/{1.73_m2} (ref >=60)
GFR, Est Non African American: 90 mL/min/{1.73_m2} (ref >=60)
Globulin: 2.2 g/dL (calc) (ref 1.9–3.7)
Glucose, Bld: 94 mg/dL (ref 65–99)
Potassium: 4.2 mmol/L (ref 3.5–5.3)
Sodium: 140 mmol/L (ref 135–146)
Total Bilirubin: 0.5 mg/dL (ref 0.2–1.2)
Total Protein: 7 g/dL (ref 6.1–8.1)

## 2018-07-01 LAB — LIPASE: Lipase: 22 U/L (ref 7–60)

## 2018-07-01 NOTE — Telephone Encounter (Signed)
Patient called and informed of blood work.  Patients stool specimen results are not back yet.  Will inform patient once we them.  Patient states that she's not feeling 100% yet but will give it time.

## 2018-07-03 LAB — GASTROINTESTINAL PATHOGEN PANEL PCR
C. difficile Tox A/B, PCR: NOT DETECTED
Campylobacter, PCR: NOT DETECTED
Cryptosporidium, PCR: NOT DETECTED
E coli (ETEC) LT/ST PCR: NOT DETECTED
E coli (STEC) stx1/stx2, PCR: NOT DETECTED
E coli 0157, PCR: NOT DETECTED
Giardia lamblia, PCR: NOT DETECTED
Norovirus, PCR: NOT DETECTED
Rotavirus A, PCR: NOT DETECTED
Salmonella, PCR: NOT DETECTED
Shigella, PCR: NOT DETECTED

## 2018-07-04 ENCOUNTER — Telehealth: Payer: Self-pay | Admitting: *Deleted

## 2018-07-04 NOTE — Telephone Encounter (Signed)
LM with stool cx results and to call back if he has any questions or concerns.

## 2019-03-24 ENCOUNTER — Encounter: Payer: Self-pay | Admitting: Emergency Medicine

## 2019-03-24 ENCOUNTER — Emergency Department
Admission: EM | Admit: 2019-03-24 | Discharge: 2019-03-24 | Disposition: A | Discharge status: Home / Self Care | Payer: BC Managed Care – PPO | Source: Home / Self Care

## 2019-03-24 ENCOUNTER — Other Ambulatory Visit: Payer: Self-pay

## 2019-03-24 DIAGNOSIS — G43001 Migraine without aura, not intractable, with status migrainosus: Secondary | ICD-10-CM | POA: Diagnosis not present

## 2019-03-24 HISTORY — DX: Chronic obstructive pulmonary disease, unspecified: J44.9

## 2019-03-24 HISTORY — DX: Unspecified asthma, uncomplicated: J45.909

## 2019-03-24 MED ORDER — ONDANSETRON 4 MG PO TBDP: 4.0000 mg | ORAL_TABLET | Freq: Three times a day (TID) | ORAL | 0 refills | Status: DC | PRN

## 2019-03-24 MED ORDER — DEXAMETHASONE SODIUM PHOSPHATE 10 MG/ML IJ SOLN
10.0000 mg | Freq: Once | INTRAMUSCULAR | Status: AC
  Administered 2019-03-24: 10 mg via INTRAMUSCULAR

## 2019-03-24 MED ORDER — METOCLOPRAMIDE HCL 5 MG/ML IJ SOLN
5.0000 mg | Freq: Once | INTRAMUSCULAR | Status: AC
  Administered 2019-03-24: 5 mg via INTRAMUSCULAR

## 2019-03-24 NOTE — Discharge Instructions (Signed)
°  Please try to get plenty of rest and stay well hydrated. You may try to eat a small bland meal to help get some food on your stomach to help with symptoms.   Please follow up with family medicine in 2-3 days if not improving.  Call 911 or go to the hospital if symptoms worsening including worsening pain, unable to keep down fluids, passing out, one sided weakness/numbness, or other new symptoms develop.

## 2019-03-24 NOTE — ED Provider Notes (Signed)
Joanna Zimmerman CARE    CSN: [id number] Arrival date & time: 03/24/19  1527     History   Chief Complaint Chief Complaint  Patient presents with  . Headache    HPI Joanna Zimmerman is a 33 y.o. female.   HPI  Joanna Zimmerman is a 33 y.o. female presenting to UC with c/o gradually worsening headache that started this morning after waking up. HA gradual in onset, primarily Right sided at this time, c/w prior migraines. Associated nausea and vomiting.  She has mild SOB but states she was recently dx with COPD and asthma. She has not needed to use her inhaler more than normal.  She took Excedrin around 12PM but no relief.  Pt works at the [employer] and is suppose to go in her her evening shift at [facility].  She hopes to get her HA under control by then, but if not, she will need a work note. Denies fever, chills, cough, congestion, chest tightness, neck pain or stiffness.    Past Medical History:  Diagnosis Date  . Anxiety   . Asthma   . COPD (chronic obstructive pulmonary disease) ([place])   . Ectopic pregnancy   . Fibromyalgia   . H/O multiple allergies   . Migraines   . PCOS (polycystic ovarian syndrome)   . Raynaud disease     There are no active problems to display for this patient.   Past Surgical History:  Procedure Laterality Date  . CHOLECYSTECTOMY    . ECTOPIC PREGNANCY SURGERY      OB History   No obstetric history on file.      Home Medications    Prior to Admission medications   Medication Sig Start Date End Date Taking? Authorizing Provider  albuterol (VENTOLIN HFA) 108 (90 Base) MCG/ACT inhaler Inhale into the lungs every 6 (six) hours as needed for wheezing or shortness of breath.   Yes [provider]  montelukast (SINGULAIR) 10 MG tablet Take 10 mg by mouth at bedtime.   Yes [provider]  aspirin EC 81 MG tablet Take 81 mg by mouth daily.    [provider]  atenolol (TENORMIN) 50 MG tablet Take 50 mg  by mouth daily.    [provider]  azithromycin (ZITHROMAX) 250 MG tablet Take 1 tablet (250 mg total) by mouth daily. Take first 2 tablets together, then 1 every day until finished. 06/30/18   Noe Gens, PA-C  benzonatate (TESSALON) 100 MG capsule Take 1 capsule (100 mg total) by mouth every 8 (eight) hours. 08/25/17   Caccavale, Sophia, PA-C  Botulinum Toxin Type A (BOTOX) 200 units SOLR Inject as directed.    [provider]  busPIRone (BUSPAR) 15 MG tablet Take 15 mg by mouth 3 (three) times daily.    [provider]  cetirizine (ZYRTEC) 10 MG tablet Take 10 mg by mouth daily.    [provider]  cyclobenzaprine (FLEXERIL) 10 MG tablet Take 1 tablet (10 mg total) by mouth at bedtime. 06/01/18   Kandra Nicolas, MD  dicyclomine (BENTYL) 10 MG capsule Take 10 mg by mouth 4 (four) times daily -  before meals and at bedtime.    [provider]  diphenhydrAMINE (BENADRYL) 25 mg capsule Take 2 capsules (50 mg total) by mouth every 6 (six) hours. 11/26/16 11/30/16  Leo Grosser, MD  DULoxetine (CYMBALTA) 30 MG capsule Take 30 mg by mouth daily.    [provider]  EPINEPHrine 0.3 mg/0.3  mL IJ SOAJ injection Inject into the muscle once.    [provider]  etonogestrel-ethinyl estradiol (NUVARING) 0.12-0.015 MG/24HR vaginal ring Place 1 each vaginally every 28 (twenty-eight) days. Insert vaginally and leave in place for 3 consecutive weeks, then remove for 1 week.    [provider]  famotidine (PEPCID) 40 MG tablet Take 1 tablet (40 mg total) by mouth 2 (two) times daily. 11/26/16 11/30/16  Lyndal PulleyKnott, Daniel, MD  fluticasone (FLONASE) 50 MCG/ACT nasal spray Place 1 spray into both nostrils daily. 08/25/17   Caccavale, Sophia, PA-C  gabapentin (NEURONTIN) 800 MG tablet Take 800 mg by mouth 3 (three) times daily.    [provider]  HYDROcodone-acetaminophen (NORCO/VICODIN) 5-325 MG tablet Take 1 tablet by mouth every 6 (six)  hours as needed for moderate pain. 06/01/18   Lattie HawBeese, Stephen A, MD  hydrocortisone cream 0.5 % Apply 1 application topically 2 (two) times daily.    [provider]  Ibuprofen-Famotidine (DUEXIS) 800-26.6 MG TABS Take by mouth.    [provider]  ketoconazole (NIZORAL) 2 % cream Apply 1 application topically daily.    [provider]  lidocaine (LIDODERM) 5 % Place 1 patch onto the skin daily. Remove & Discard patch within 12 hours or as directed by MD    [provider]  omeprazole (PRILOSEC) 40 MG capsule Take 40 mg by mouth daily.    [provider]  ondansetron (ZOFRAN ODT) 4 MG disintegrating tablet Take 1 tablet (4 mg total) by mouth every 8 (eight) hours as needed. 03/24/19   Lurene ShadowPhelps, Aasha Dina O, PA-C  ondansetron (ZOFRAN) 4 MG tablet Take 4 mg by mouth every 8 (eight) hours as needed for nausea or vomiting.    [provider]  predniSONE (DELTASONE) 20 MG tablet Take one tab by mouth twice daily for 4 days, then one daily for 3 days. Take with food. 06/01/18   Lattie HawBeese, Stephen A, MD  promethazine (PHENERGAN) 25 MG tablet Take 1 tablet (25 mg total) by mouth every 6 (six) hours as needed. 06/30/18   Lurene ShadowPhelps, Alayjah Boehringer O, PA-C  vitamin B-12 (CYANOCOBALAMIN) 1000 MCG tablet Take 1,000 mcg by mouth daily.    [provider]    Family History History reviewed. No pertinent family history.  Social History Social History   Tobacco Use  . Smoking status: Current Every Day Smoker    Types: E-cigarettes  . Smokeless tobacco: Never Used  Substance Use Topics  . Alcohol use: No  . Drug use: No     Allergies   Cefdinir, Penicillins, Baclofen, Doxycycline, Levetiracetam, Levofloxacin, Nifedipine, Relafen [nabumetone], Robaxin [methocarbamol], and Venlafaxine   Review of Systems Review of Systems  Constitutional: Negative for chills and fever.  HENT: Negative for congestion, ear pain, sore throat, trouble swallowing and voice change.    Eyes: Positive for photophobia. Negative for visual disturbance.  Respiratory: Positive for shortness of breath. Negative for cough.   Cardiovascular: Negative for chest pain and palpitations.  Gastrointestinal: Positive for nausea and vomiting. Negative for abdominal pain and diarrhea.  Musculoskeletal: Negative for arthralgias, back pain and myalgias.  Skin: Negative for rash.  Neurological: Positive for headaches. Negative for dizziness and light-headedness.     Physical Exam Triage Vital Signs ED Triage Vitals  Enc Vitals Group     BP      Pulse      Resp      Temp      Temp src      SpO2  Weight      Height      Head Circumference      Peak Flow      Pain Score      Pain Loc      Pain Edu?      Excl. in GC?    No data found.  Updated Vital Signs BP 135/88 (BP Location: Right Arm)   Pulse 80   Temp 98.1 F (36.7 C) (Oral)   SpO2 98%   Visual Acuity Right Eye Distance:   Left Eye Distance:   Bilateral Distance:    Right Eye Near:   Left Eye Near:    Bilateral Near:     Physical Exam Vitals signs and nursing note reviewed.  Constitutional:      General: She is not in acute distress.    Appearance: She is well-developed.  HENT:     Head: Normocephalic and atraumatic.     Right Ear: Tympanic membrane normal.     Left Ear: Tympanic membrane normal.     Nose: Nose normal.     Right Sinus: No maxillary sinus tenderness or frontal sinus tenderness.     Left Sinus: No maxillary sinus tenderness or frontal sinus tenderness.     Mouth/Throat:     Lips: Pink.     Mouth: Mucous membranes are moist.     Pharynx: Oropharynx is clear. Uvula midline.  Eyes:     Extraocular Movements: Extraocular movements intact.     Pupils: Pupils are equal, round, and reactive to light.  Neck:     Musculoskeletal: Normal range of motion and neck supple. No neck rigidity.  Cardiovascular:     Rate and Rhythm: Normal rate.  Pulmonary:     Effort: Pulmonary effort is  normal.     Breath sounds: Normal breath sounds.  Musculoskeletal: Normal range of motion.  Skin:    General: Skin is warm and dry.  Neurological:     Mental Status: She is alert and oriented to person, place, and time.     GCS: GCS eye subscore is 4. GCS verbal subscore is 5. GCS motor subscore is 6.     Cranial Nerves: No cranial nerve deficit.     Sensory: No sensory deficit.     Motor: No weakness.     Coordination: Romberg sign negative. Coordination normal.     Gait: Gait normal.  Psychiatric:        Mood and Affect: Mood normal.        Behavior: Behavior normal.      UC Treatments / Results  Labs (all labs ordered are listed, but only abnormal results are displayed) Labs Reviewed - No data to display  EKG   Radiology No results found.  Procedures Procedures (including critical care time)  Medications Ordered in UC Medications  metoCLOPramide (REGLAN) injection 5 mg (5 mg Intramuscular Given 03/24/19 1614)  dexamethasone (DECADRON) injection 10 mg (10 mg Intramuscular Given 03/24/19 1614)    Initial Impression / Assessment and Plan / UC Course  I have reviewed the triage vital signs and the nursing notes.  Pertinent labs & imaging results that were available during my care of the patient were reviewed by me and considered in my medical decision making (see chart for details).     Hx and exam c/w migraine w/o red flag symptoms.  Pt given Reglan and Decadron in UC Home care info provided including work note for this evening if needed.  Doubt Covid at this  time.   Final Clinical Impressions(s) / UC Diagnoses   Final diagnoses:  Migraine without aura and with status migrainosus, not intractable     Discharge Instructions      Please try to get plenty of rest and stay well hydrated. You may try to eat a small bland meal to help get some food on your stomach to help with symptoms.   Please follow up with family medicine in 2-3 days if not improving.   Call 911 or go to the hospital if symptoms worsening including worsening pain, unable to keep down fluids, passing out, one sided weakness/numbness, or other new symptoms develop.     ED Prescriptions    Medication Sig Dispense Auth. Provider   ondansetron (ZOFRAN ODT) 4 MG disintegrating tablet Take 1 tablet (4 mg total) by mouth every 8 (eight) hours as needed. 8 tablet Lurene ShadowPhelps, Ambur Province O, PA-C     Controlled Substance Prescriptions [place] Controlled Substance Registry consulted? Not Applicable   Rolla Platehelps, Blanton Kardell O, PA-C 03/25/19 1129

## 2019-03-24 NOTE — ED Triage Notes (Signed)
Pt c/o HA, N+V that started today. States she has hx of migraines and was recently diagnosed with COPD and asthma. Took excedrin at 12pm.

## 2019-04-06 IMAGING — CT CT ABD-PELV W/ CM
2 of 4 series · 16 of 46 positions shown, 18 images · IV contrast (APPLIED)
Comparison: None.

CLINICAL DATA: Right lower quadrant pain with nausea and vomiting

EXAM:
CT ABDOMEN AND PELVIS WITH CONTRAST
TECHNIQUE: Multidetector CT imaging of the abdomen and pelvis was performed
using the standard protocol following bolus administration of
intravenous contrast.
CONTRAST:  100mL 8Z83LE-1TT IOPAMIDOL (8Z83LE-1TT) INJECTION 61%

[Series 2: axial st · axial · 0.98mm/px · z∈[-580,-70]mm · 13 of 112 slices shown, 15 images]
[im 5/112  soft-tissue]
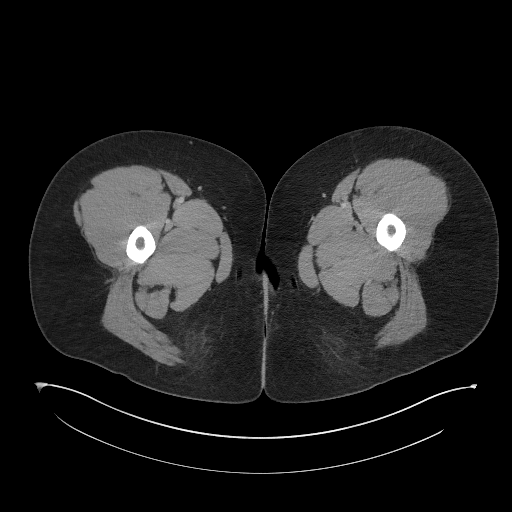
[im 5/112  bone]
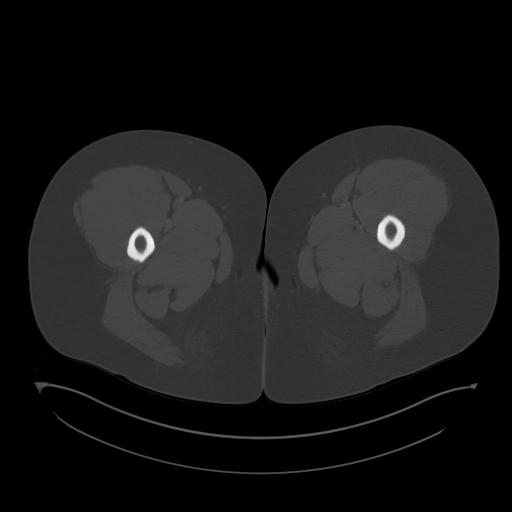
[im 14/112  soft-tissue]
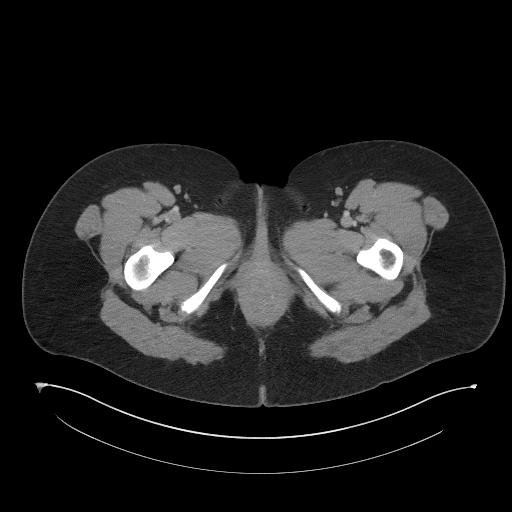
[im 23/112  soft-tissue]
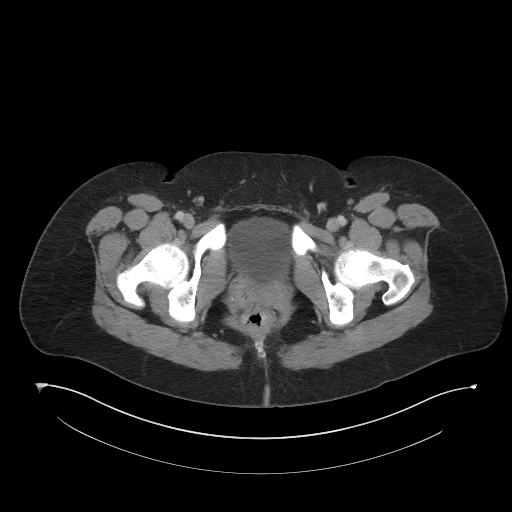
[im 32/112  soft-tissue]
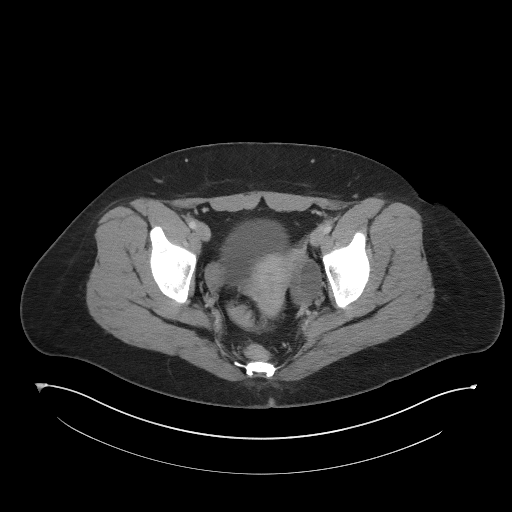
[im 40/112  soft-tissue]
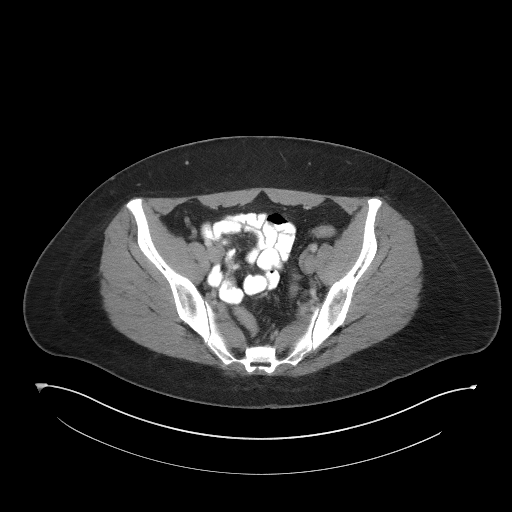
[im 49/112  soft-tissue]
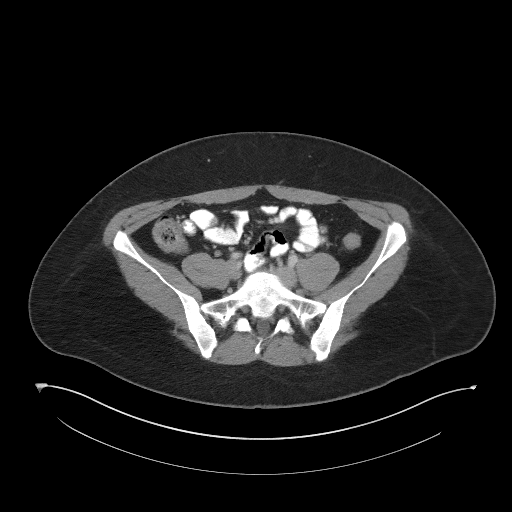
[im 58/112  soft-tissue]
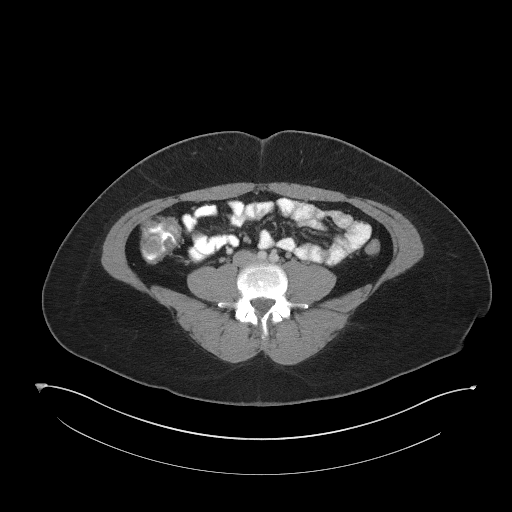
[im 63/112  soft-tissue]
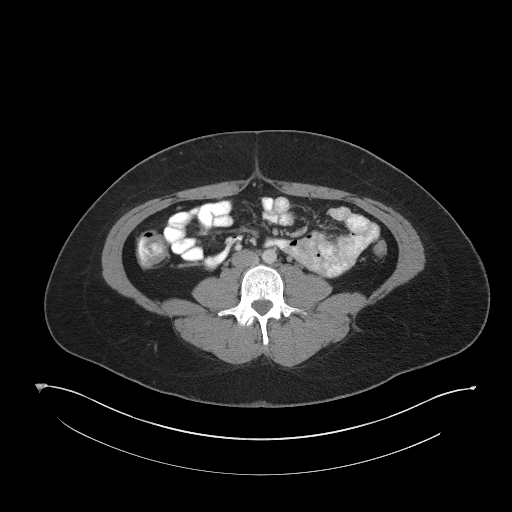
[im 72/112  soft-tissue]
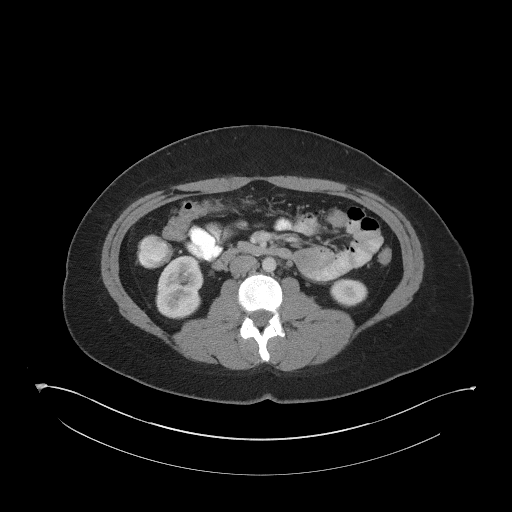
[im 72/112  bone]
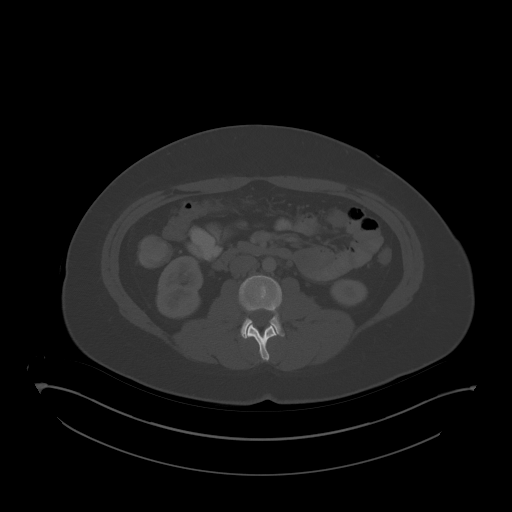
[im 80/112  soft-tissue]
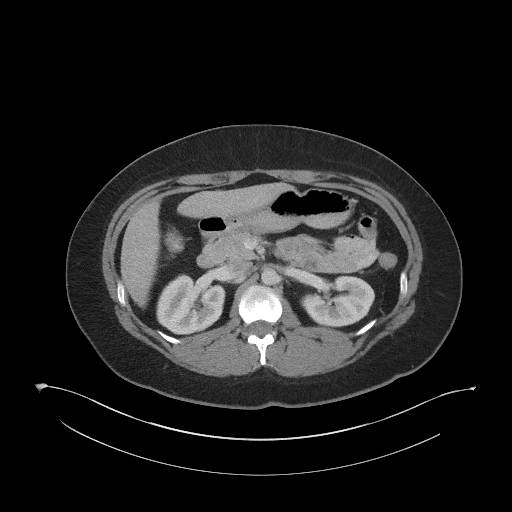
[im 89/112  soft-tissue]
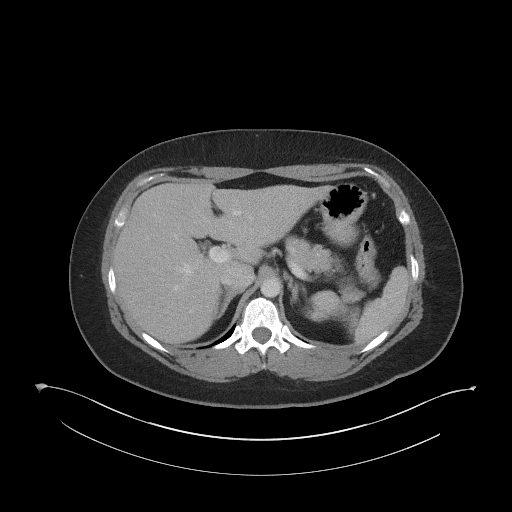
[im 98/112  soft-tissue]
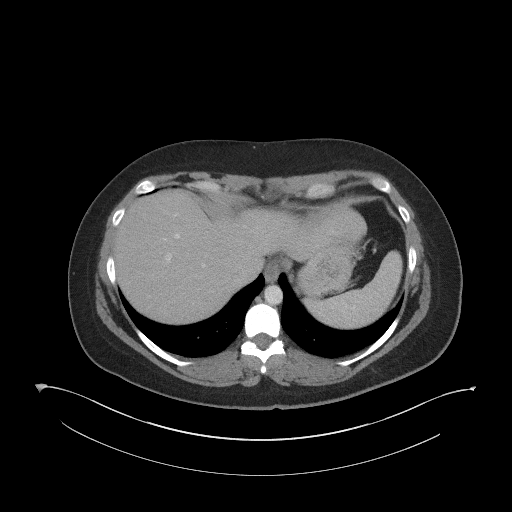
[im 107/112  soft-tissue]
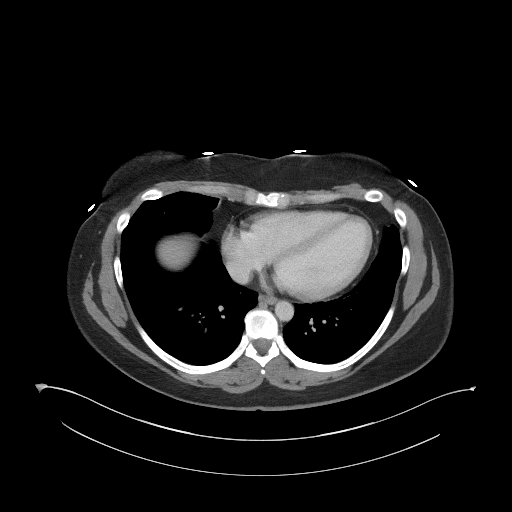

[Series 5: coronal st · coronal · 0.95mm/px · 3 of 96 slices shown]
[im 32/96  soft-tissue]
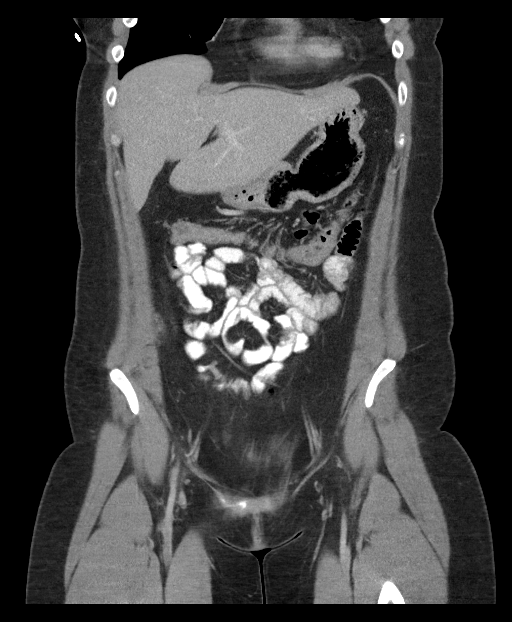
[im 43/96  soft-tissue]
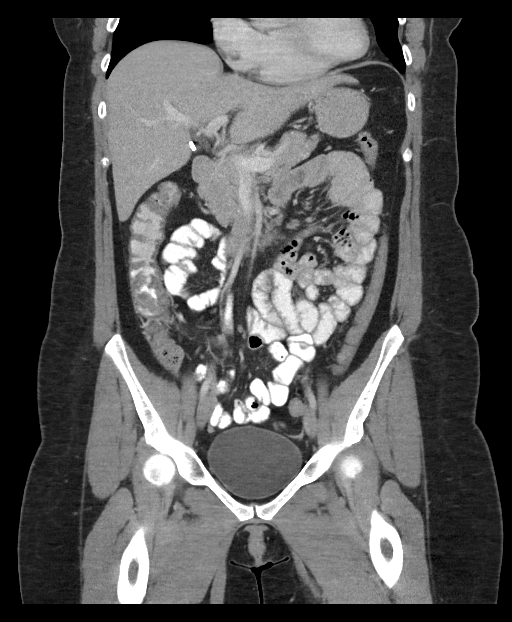
[im 53/96  soft-tissue]
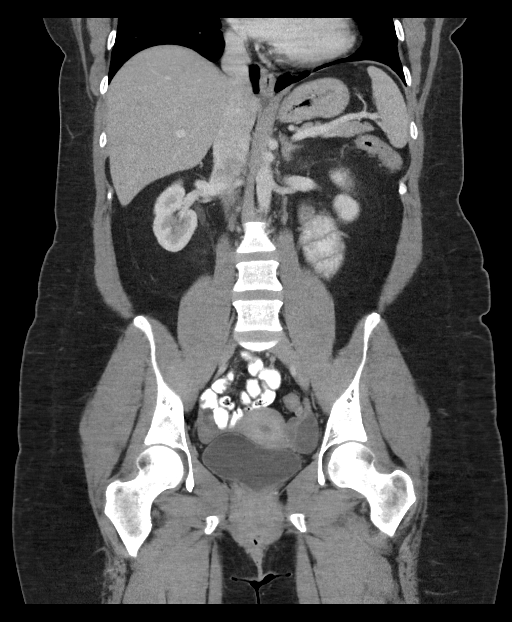

[16 of 46 positions shown; findings below may reference images not displayed]

FINDINGS: Lower chest: No acute abnormality.

Hepatobiliary: No focal liver abnormality is seen. Status post
cholecystectomy. No biliary dilatation.

Pancreas: Unremarkable. No pancreatic ductal dilatation or
surrounding inflammatory changes.

Spleen: Normal in size without focal abnormality.

Adrenals/Urinary Tract: Adrenal glands are unremarkable. Kidneys are
normal, without renal calculi, focal lesion, or hydronephrosis.
Bladder is unremarkable.

Stomach/Bowel: Stomach is within normal limits. Appendix appears
normal. No evidence of bowel wall thickening, distention, or
inflammatory changes.

Vascular/Lymphatic: No significant vascular findings are present. No
enlarged abdominal or pelvic lymph nodes.

Reproductive: Uterus is within normal limits. Bilateral ovarian
cystic changes are seen slightly more prominent on the left than the
right. The largest of these measures 3.3 cm.

Other: No abdominal wall hernia or abnormality. No abdominopelvic
ascites.

Musculoskeletal: No acute or significant osseous findings.
IMPRESSION: Ovarian cystic changes without evidence of cyst rupture.

No other focal abnormality is noted. The appendix is within normal
limits.

## 2019-09-11 ENCOUNTER — Emergency Department
Admission: EM | Admit: 2019-09-11 | Discharge: 2019-09-11 | Disposition: A | Discharge status: Home / Self Care | Payer: BC Managed Care – PPO | Source: Home / Self Care

## 2019-09-11 ENCOUNTER — Encounter: Payer: Self-pay | Admitting: Family Medicine

## 2019-09-11 ENCOUNTER — Other Ambulatory Visit: Payer: Self-pay

## 2019-09-11 DIAGNOSIS — J029 Acute pharyngitis, unspecified: Secondary | ICD-10-CM | POA: Diagnosis not present

## 2019-09-11 DIAGNOSIS — H6501 Acute serous otitis media, right ear: Secondary | ICD-10-CM

## 2019-09-11 MED ORDER — CLINDAMYCIN HCL 150 MG PO CAPS: 150.0000 mg | ORAL_CAPSULE | Freq: Three times a day (TID) | ORAL | 0 refills | Status: DC

## 2019-09-11 NOTE — ED Provider Notes (Signed)
Vinnie Langton CARE    CSN: [id number] Arrival date & time: 09/11/19  0816      History   Chief Complaint Chief Complaint  Patient presents with  . Otalgia    HPI Joanna Zimmerman is a 33 y.o. female.   33 yo established [place] patient presents with sore throat.  Symptoms began about a week ago unassociated with fever.  Over the last couple days she has developed an increasing painful ear bilaterally but worse on the right.  She has had no symptoms of Covid.     Past Medical History:  Diagnosis Date  . Anxiety   . Asthma   . COPD (chronic obstructive pulmonary disease) ([place])   . Ectopic pregnancy   . Fibromyalgia   . H/O multiple allergies   . Migraines   . PCOS (polycystic ovarian syndrome)   . Raynaud disease     There are no problems to display for this patient.   Past Surgical History:  Procedure Laterality Date  . CHOLECYSTECTOMY    . ECTOPIC PREGNANCY SURGERY      OB History   No obstetric history on file.      Home Medications    Prior to Admission medications   Medication Sig Start Date End Date Taking? Authorizing Provider  albuterol (VENTOLIN HFA) 108 (90 Base) MCG/ACT inhaler Inhale into the lungs every 6 (six) hours as needed for wheezing or shortness of breath.    [provider]  benzonatate (TESSALON) 100 MG capsule Take 1 capsule (100 mg total) by mouth every 8 (eight) hours. 08/25/17   Caccavale, Sophia, PA-C  cetirizine (ZYRTEC) 10 MG tablet Take 10 mg by mouth daily.    [provider]  clindamycin (CLEOCIN) 150 MG capsule Take 1 capsule (150 mg total) by mouth 3 (three) times daily. 09/11/19   Robyn Haber, MD  diphenhydrAMINE (BENADRYL) 25 mg capsule Take 2 capsules (50 mg total) by mouth every 6 (six) hours. 11/26/16 11/30/16  Leo Grosser, MD  EPINEPHrine 0.3 mg/0.3 mL IJ SOAJ injection Inject into the muscle once.    [provider]  famotidine (PEPCID) 40 MG tablet Take 1 tablet (40 mg total) by  mouth 2 (two) times daily. 11/26/16 11/30/16  Leo Grosser, MD  fluticasone (FLONASE) 50 MCG/ACT nasal spray Place 1 spray into both nostrils daily. 08/25/17   Caccavale, Sophia, PA-C  Ibuprofen-Famotidine (DUEXIS) 800-26.6 MG TABS Take by mouth.    [provider]  montelukast (SINGULAIR) 10 MG tablet Take 10 mg by mouth at bedtime.    [provider]  omeprazole (PRILOSEC) 40 MG capsule Take 40 mg by mouth daily.    [provider]  promethazine (PHENERGAN) 25 MG tablet Take 1 tablet (25 mg total) by mouth every 6 (six) hours as needed. 06/30/18   Noe Gens, PA-C    Family History History reviewed. No pertinent family history.  Social History Social History   Tobacco Use  . Smoking status: Current Every Day Smoker    Types: E-cigarettes  . Smokeless tobacco: Never Used  Substance Use Topics  . Alcohol use: No  . Drug use: No     Allergies   Cefdinir, Penicillins, Baclofen, Doxycycline, Levetiracetam, Levofloxacin, Nifedipine, Relafen [nabumetone], Robaxin [methocarbamol], and Venlafaxine   Review of Systems Review of Systems   Physical Exam Triage Vital Signs ED Triage Vitals  Enc Vitals Group     BP      Pulse      Resp  Temp      Temp src      SpO2      Weight      Height      Head Circumference      Peak Flow      Pain Score      Pain Loc      Pain Edu?      Excl. in GC?    No data found.  Updated Vital Signs BP 140/88 (BP Location: Right Arm)   Pulse 70   Temp 98.9 F (37.2 C) (Oral)   Ht 5\' 7"  (1.702 m)   Wt 102.1 kg   SpO2 96%   BMI 35.24 kg/m    Physical Exam Vitals and nursing note reviewed.  Constitutional:      Appearance: Normal appearance.  HENT:     Head: Normocephalic.     Right Ear: Ear canal normal.     Left Ear: Ear canal normal.     Ears:     Comments: Right TM is dull and there is loss of landmarks    Mouth/Throat:     Mouth: Mucous membranes are moist.     Pharynx: Posterior  oropharyngeal erythema present.     Comments: Uvula is erythematous as well as posterior pharynx. Eyes:     Conjunctiva/sclera: Conjunctivae normal.  Pulmonary:     Effort: Pulmonary effort is normal.  Musculoskeletal:        General: Normal range of motion.     Cervical back: Normal range of motion and neck supple.  Lymphadenopathy:     Cervical: Cervical adenopathy present.  Skin:    General: Skin is warm and dry.  Neurological:     General: No focal deficit present.     Mental Status: She is alert.  Psychiatric:        Mood and Affect: Mood normal.      UC Treatments / Results  Labs (all labs ordered are listed, but only abnormal results are displayed) Labs Reviewed - No data to display  EKG   Radiology No results found.  Procedures Procedures (including critical care time)  Medications Ordered in UC Medications - No data to display  Initial Impression / Assessment and Plan / UC Course  I have reviewed the triage vital signs and the nursing notes.  Pertinent labs & imaging results that were available during my care of the patient were reviewed by me and considered in my medical decision making (see chart for details).    Final Clinical Impressions(s) / UC Diagnoses   Final diagnoses:  Non-recurrent acute serous otitis media of right ear  Acute pharyngitis, unspecified etiology     Discharge Instructions     Covid prevention measures:  Vitamin D3 5000 IU (125 mg) daily Vitamin C 500 mg twice daily Zinc 50 to 75 mg daily  Listerine type mouthwash 4 times a day       ED Prescriptions    Medication Sig Dispense Auth. Provider   clindamycin (CLEOCIN) 150 MG capsule Take 1 capsule (150 mg total) by mouth 3 (three) times daily. 28 capsule , MD     I have reviewed the PDMP during this encounter.   Elvina Sidle, MD 09/11/19 [phone]

## 2019-09-11 NOTE — Discharge Instructions (Addendum)
Covid prevention measures:  Vitamin D3 5000 IU (125 mg) daily Vitamin C 500 mg twice daily Zinc 50 to 75 mg daily  Listerine type mouthwash 4 times a day

## 2019-09-11 NOTE — ED Triage Notes (Signed)
RT ear pain, sore throat, headache x 1 week

## 2021-08-30 ENCOUNTER — Other Ambulatory Visit: Payer: Self-pay

## 2021-08-30 ENCOUNTER — Encounter (HOSPITAL_COMMUNITY): Payer: Self-pay | Admitting: Obstetrics and Gynecology

## 2021-08-30 ENCOUNTER — Inpatient Hospital Stay (HOSPITAL_COMMUNITY)
Admission: AD | Admit: 2021-08-30 | Discharge: 2021-08-30 | Disposition: A | Discharge status: Home / Self Care | Payer: BC Managed Care – PPO | Attending: Obstetrics and Gynecology | Admitting: Obstetrics and Gynecology

## 2021-08-30 DIAGNOSIS — F17290 Nicotine dependence, other tobacco product, uncomplicated: Secondary | ICD-10-CM | POA: Insufficient documentation

## 2021-08-30 DIAGNOSIS — O99335 Smoking (tobacco) complicating the puerperium: Secondary | ICD-10-CM | POA: Diagnosis not present

## 2021-08-30 DIAGNOSIS — O165 Unspecified maternal hypertension, complicating the puerperium: Secondary | ICD-10-CM

## 2021-08-30 NOTE — MAU Note (Signed)
Joanna Zimmerman is a 35 y.o. here in MAU reporting: SVD on 12/9 at [facility]. States baby was born preterm at 32.4 due to pre-eclampsia. States she tore and got stitches. The bleeding had been light. Today bleeding had gotten heavier and is thinking maybe she lost a stitch.  Onset of complaint: today  Pain score: 4/10  Vitals:   08/30/21 1649  BP: (!) 139/100  Pulse: 89  Resp: 18  Temp: 99.4 F (37.4 C)  SpO2: 98%     Lab orders placed from triage: none

## 2021-08-30 NOTE — Discharge Instructions (Signed)
Return to nearest hospital: If you have heavier bleeding that soaks through more that 2 pads per hour for an hour or more If you bleed so much that you feel like you might pass out or you do pass out If you have significant abdominal pain that is not improved with Tylenol 1000 mg every 8 hours as needed for pain If you develop a fever > 100.5  

## 2021-08-30 NOTE — MAU Provider Note (Signed)
History     CSN: [id number]  Arrival date and time: 08/30/21 1628   Event Date/Time   First Provider Initiated Contact with Patient 08/30/21 1850      Chief Complaint  Patient presents with   Vaginal Bleeding   Vaginal Pain   Joanna Zimmerman is a 35 y.o. year old G3P0121 female at 66 days PP s/p SVD @ 36.4 wks who presents to MAU reporting heavy vaginal bleeding. She reports she had a preterm delivery 2/2 cHTN with SIPEC. She reports she saturated 2 pads in an hour. Her activity level changed today by her driving 10 minutes away from her house; she gone for about 1 hour. She also reports "a rough morning" with the baby cluster feeding this morning. She denies passing clots. She had a vaginal laceration with delivery and is concerned "a stitch may have popped causing the increased bleeding." She states every time she wiped in the BR, she felt like the bleeding was coming from inside the vagina and not in the area of the stitches. Her friend is present and contributing to the history taking.   OB History     Gravida  3   Para  1   Term      Preterm  1   AB  2   Living  1      SAB  2   IAB      Ectopic      Multiple      Live Births  1           Past Medical History:  Diagnosis Date   Anxiety    Asthma    COPD (chronic obstructive pulmonary disease) (HCC)    Ectopic pregnancy    Fibromyalgia    H/O multiple allergies    Migraines    PCOS (polycystic ovarian syndrome)    Raynaud disease     Past Surgical History:  Procedure Laterality Date   CHOLECYSTECTOMY     ECTOPIC PREGNANCY SURGERY     MEDIAL COLLATERAL LIGAMENT REPAIR, KNEE Left     History reviewed. No pertinent family history.  Social History   Tobacco Use   Smoking status: Every Day    Types: E-cigarettes   Smokeless tobacco: Never  Vaping Use   Vaping Use: Never used  Substance Use Topics   Alcohol use: No   Drug use: No    Allergies:  Allergies  Allergen  Reactions   Cefdinir Anaphylaxis   Penicillins Anaphylaxis   Baclofen Nausea And Vomiting   Doxycycline Diarrhea   Levetiracetam Swelling   Levofloxacin Other (See Comments)    Muscles froze   Nifedipine Other (See Comments)    Hand tingling   Relafen [Nabumetone] Nausea And Vomiting   Robaxin [Methocarbamol] Nausea And Vomiting   Venlafaxine Rash    Medications Prior to Admission  Medication Sig Dispense Refill Last Dose   fexofenadine (ALLEGRA) 180 MG tablet Take 180 mg by mouth daily.   08/30/2021   albuterol (VENTOLIN HFA) 108 (90 Base) MCG/ACT inhaler Inhale into the lungs every 6 (six) hours as needed for wheezing or shortness of breath.      benzonatate (TESSALON) 100 MG capsule Take 1 capsule (100 mg total) by mouth every 8 (eight) hours. 21 capsule 0    cetirizine (ZYRTEC) 10 MG tablet Take 10 mg by mouth daily.      clindamycin (CLEOCIN) 150 MG capsule Take 1 capsule (150 mg total) by mouth 3 (three) times daily.  28 capsule 0    diphenhydrAMINE (BENADRYL) 25 mg capsule Take 2 capsules (50 mg total) by mouth every 6 (six) hours. 32 capsule 0    EPINEPHrine 0.3 mg/0.3 mL IJ SOAJ injection Inject into the muscle once.      famotidine (PEPCID) 40 MG tablet Take 1 tablet (40 mg total) by mouth 2 (two) times daily. 8 tablet 0    fluticasone (FLONASE) 50 MCG/ACT nasal spray Place 1 spray into both nostrils daily. 16 g 0    Ibuprofen-Famotidine (DUEXIS) 800-26.6 MG TABS Take by mouth.      montelukast (SINGULAIR) 10 MG tablet Take 10 mg by mouth at bedtime.      omeprazole (PRILOSEC) 40 MG capsule Take 40 mg by mouth daily.      promethazine (PHENERGAN) 25 MG tablet Take 1 tablet (25 mg total) by mouth every 6 (six) hours as needed. 10 tablet 0     Review of Systems  Constitutional: Negative.   HENT: Negative.    Eyes: Negative.   Respiratory: Negative.    Cardiovascular: Negative.   Gastrointestinal: Negative.   Endocrine: Negative.   Genitourinary:  Positive for vaginal  bleeding (soaked 2 pads in 1 hour).  Musculoskeletal: Negative.   Skin: Negative.   Allergic/Immunologic: Negative.   Neurological: Negative.   Hematological: Negative.   Psychiatric/Behavioral: Negative.    Physical Exam   Patient Vitals for the past 24 hrs:  BP Temp Temp src Pulse Resp SpO2 Height Weight  08/30/21 1902 126/81 98.4 F (36.9 C) Oral 81 16 100 % -- --  08/30/21 1846 (!) 140/93 -- -- 81 -- -- -- --  08/30/21 1835 (!) 141/91 -- -- 77 -- -- -- --  08/30/21 1649 (!) 139/100 99.4 F (37.4 C) Oral 89 18 98 % -- --  08/30/21 1645 -- -- -- -- -- -- 5\' 7"  (1.702 m) 111.9 kg     Physical Exam Vitals and nursing note reviewed. Exam conducted with a chaperone present.  Constitutional:      Appearance: Normal appearance. She is obese.  Genitourinary:    General: Normal vulva.     Comments: Vaginal laceration repair well-approximated; no evidence of wound dehiscence  Musculoskeletal:        General: Normal range of motion.  Skin:    General: Skin is warm and dry.  Neurological:     Mental Status: She is alert and oriented to person, place, and time.  Psychiatric:        Mood and Affect: Mood normal.        Behavior: Behavior normal.        Thought Content: Thought content normal.        Judgment: Judgment normal.    MAU Course  Procedures  MDM Vaginal exam revealing well-approximated edges. No erythema, edema, drainage or foul-smell PEC labs not done >>cHTN well-managed on Labetalol 100 mg BID (elevated numbers during patient pumping or moving around in the bed. Once settled in bed, BP numbers were better.  Assessment and Plan  Delayed postpartum hemorrhage   Postpartum hypertension  - Continue Labetalol 100 mg BID - F/U as scheduled with [facility]  - Discharge patient  - Patient verbalized an understanding of the plan of care and agrees.   , CNM 08/30/2021, 6:50 PM

## 2022-01-03 ENCOUNTER — Other Ambulatory Visit: Payer: Self-pay

## 2022-01-03 ENCOUNTER — Emergency Department (HOSPITAL_BASED_OUTPATIENT_CLINIC_OR_DEPARTMENT_OTHER): Payer: BC Managed Care – PPO

## 2022-01-03 ENCOUNTER — Encounter (HOSPITAL_BASED_OUTPATIENT_CLINIC_OR_DEPARTMENT_OTHER): Payer: Self-pay | Admitting: Emergency Medicine

## 2022-01-03 ENCOUNTER — Emergency Department (HOSPITAL_BASED_OUTPATIENT_CLINIC_OR_DEPARTMENT_OTHER)
Admission: EM | Admit: 2022-01-03 | Discharge: 2022-01-03 | Disposition: A | Discharge status: Home / Self Care | Payer: BC Managed Care – PPO | Attending: Emergency Medicine | Admitting: Emergency Medicine

## 2022-01-03 DIAGNOSIS — R059 Cough, unspecified: Secondary | ICD-10-CM | POA: Diagnosis not present

## 2022-01-03 DIAGNOSIS — R079 Chest pain, unspecified: Secondary | ICD-10-CM

## 2022-01-03 DIAGNOSIS — R0789 Other chest pain: Secondary | ICD-10-CM | POA: Diagnosis present

## 2022-01-03 LAB — CBC
HCT: 39.2 % (ref 36.0–46.0)
Hemoglobin: 13.2 g/dL (ref 12.0–15.0)
MCH: 28.1 pg (ref 26.0–34.0)
MCHC: 33.7 g/dL (ref 30.0–36.0)
MCV: 83.4 fL (ref 80.0–100.0)
Platelets: 352 10*3/uL (ref 150–400)
RBC: 4.7 MIL/uL (ref 3.87–5.11)
RDW: 13.6 % (ref 11.5–15.5)
WBC: 11.7 10*3/uL — ABNORMAL HIGH (ref 4.0–10.5)
nRBC: 0 % (ref 0.0–0.2)

## 2022-01-03 LAB — D-DIMER, QUANTITATIVE: D-Dimer, Quant: <0.27 ug/mL-FEU (ref 0.00–0.50)

## 2022-01-03 LAB — BASIC METABOLIC PANEL
Anion gap: 9 (ref 5–15)
BUN: 13 mg/dL (ref 6–20)
CO2: 22 mmol/L (ref 22–32)
Calcium: 8.6 mg/dL — ABNORMAL LOW (ref 8.9–10.3)
Chloride: 106 mmol/L (ref 98–111)
Creatinine, Ser: 0.91 mg/dL (ref 0.44–1.00)
GFR, Estimated: >60 mL/min (ref >60)
Glucose, Bld: 135 mg/dL — ABNORMAL HIGH (ref 70–99)
Potassium: 3.5 mmol/L (ref 3.5–5.1)
Sodium: 137 mmol/L (ref 135–145)

## 2022-01-03 LAB — TROPONIN I (HIGH SENSITIVITY): Troponin I (High Sensitivity): 2 ng/L (ref <18)

## 2022-01-03 MED ORDER — ACETAMINOPHEN 325 MG PO TABS
650.0000 mg | ORAL_TABLET | Freq: Once | ORAL | Status: AC
  Administered 2022-01-03: 650 mg via ORAL
  Filled 2022-01-03: qty 2

## 2022-01-03 NOTE — ED Notes (Signed)
Pt has knee immobilizer on right knee, also reports right wrist pain, non r/t visit today ?

## 2022-01-03 NOTE — ED Triage Notes (Signed)
Pt arrives pov, slow gait, with c/o  non radiating right side CP and shob since last night. Pt speaking in complete sentences. Currently taking abx for lung infection, treated by pulmonologist ?

## 2022-01-03 NOTE — ED Notes (Signed)
ED Provider at bedside. 

## 2022-01-03 NOTE — ED Notes (Signed)
Called lab to add on d-dimer.  

## 2022-01-03 NOTE — ED Provider Notes (Signed)
?[facility] EMERGENCY DEPARTMENT ?Provider Note ? ? ?CSN: [id number] ?Arrival date & time: 01/03/22  6222 ? ?  ? ?History ? ?Chief Complaint  ?Patient presents with  ? Chest Pain  ? ? ?Joanna Zimmerman is a 36 y.o. female. ? ?Patient presenting with chief complaint of right-sided chest pain.  She states she had a cough for over 2 weeks now.  She finished antibiotics given by her pulmonologist.  She was advised to go to the ER if she had worsening symptoms.  She states starting last night she started develop right-sided chest pain anytime she took a very deep breath or had a coughing episode.  At rest she states the pain is nearly gone but feels some discomfort on the right chest.  Denies any shortness of breath.  No reports of fevers.  No vomiting or diarrhea. ? ? ?  ? ?Home Medications ?Prior to Admission medications   ?Medication Sig Start Date End Date Taking? Authorizing Provider  ?albuterol (VENTOLIN HFA) 108 (90 Base) MCG/ACT inhaler Inhale into the lungs every 6 (six) hours as needed for wheezing or shortness of breath.    [provider]  ?cetirizine (ZYRTEC) 10 MG tablet Take 10 mg by mouth daily.    [provider]  ?EPINEPHrine 0.3 mg/0.3 mL IJ SOAJ injection Inject into the muscle once.    [provider]  ?fexofenadine (ALLEGRA) 180 MG tablet Take 180 mg by mouth daily.    [provider]  ?fluticasone (FLONASE) 50 MCG/ACT nasal spray Place 1 spray into both nostrils daily. 08/25/17   Caccavale, Sophia, PA-C  ?montelukast (SINGULAIR) 10 MG tablet Take 10 mg by mouth at bedtime.    [provider]  ?omeprazole (PRILOSEC) 40 MG capsule Take 40 mg by mouth daily.    [provider]  ?   ? ?Allergies    ?Cefdinir, Penicillins, Baclofen, Doxycycline, Levetiracetam, Levofloxacin, Nifedipine, Relafen [nabumetone], Robaxin [methocarbamol], and Venlafaxine   ? ?Review of Systems   ?Review of Systems  ?Constitutional:  Negative for fever.   ?HENT:  Negative for ear pain.   ?Eyes:  Negative for pain.  ?Respiratory:  Negative for cough.   ?Cardiovascular:  Negative for chest pain.  ?Gastrointestinal:  Negative for abdominal pain.  ?Genitourinary:  Negative for flank pain.  ?Musculoskeletal:  Negative for back pain.  ?Skin:  Negative for rash.  ?Neurological:  Negative for headaches.  ? ?Physical Exam ?Updated Vital Signs ?BP 118/78   Pulse 68   Temp 97.9 ?F (36.6 ?C) (Oral)   Resp (!) 27   Ht 5\' 7"  (1.702 m)   Wt 127 kg   LMP 12/20/2021   SpO2 96%   BMI 43.84 kg/m?  ?Physical Exam ?Constitutional:   ?   General: She is not in acute distress. ?   Appearance: Normal appearance.  ?HENT:  ?   Head: Normocephalic.  ?   Nose: Nose normal.  ?Eyes:  ?   Extraocular Movements: Extraocular movements intact.  ?Cardiovascular:  ?   Rate and Rhythm: Normal rate.  ?Pulmonary:  ?   Effort: Pulmonary effort is normal.  ?Musculoskeletal:     ?   General: Normal range of motion.  ?   Cervical back: Normal range of motion.  ?   Right lower leg: No edema.  ?   Left lower leg: No edema.  ?   Comments: Right chest wall tenderness on palpation, reproduces her symptoms.  ?Neurological:  ?   General: No focal deficit  present.  ?   Mental Status: She is alert. Mental status is at baseline.  ? ? ?ED Results / Procedures / Treatments   ?Labs ?(all labs ordered are listed, but only abnormal results are displayed) ?Labs Reviewed  ?BASIC METABOLIC PANEL - Abnormal; Notable for the following components:  ?    Result Value  ? Glucose, Bld 135 (*)   ? Calcium 8.6 (*)   ? All other components within normal limits  ?CBC - Abnormal; Notable for the following components:  ? WBC 11.7 (*)   ? All other components within normal limits  ?D-DIMER, QUANTITATIVE  ?TROPONIN I (HIGH SENSITIVITY)  ? ? ?EKG ?EKG Interpretation ? ?Date/Time:  Sunday January 03 2022 08:47:47 EDT ?Ventricular Rate:  68 ?PR Interval:  185 ?QRS Duration: 92 ?QT Interval:  418 ?QTC Calculation: 448 ?R  Axis:   -35 ?Text Interpretation: Sinus rhythm Left axis deviation Low voltage, precordial leads RSR' in V1 or V2, probably normal variant Consider anterior infarct Confirmed by Norman Clay ([id number]) on 01/03/2022 8:55:33 AM ? ?Radiology ?DG Chest 2 View ? ?Result Date: 01/03/2022 ?CLINICAL DATA:  Chest pain and shortness of breath EXAM: CHEST - 2 VIEW COMPARISON:  December 27, 2021 FINDINGS: The heart size and mediastinal contours are within normal limits. Both lungs are clear. The visualized skeletal structures are unremarkable. IMPRESSION: No active cardiopulmonary disease. Electronically Signed   By: Sherian Rein M.D.   On: 01/03/2022 09:41   ? ?Procedures ?Procedures  ? ? ?Medications Ordered in ED ?Medications  ?acetaminophen (TYLENOL) tablet 650 mg (650 mg Oral Given 01/03/22 0946)  ? ? ?ED Course/ Medical Decision Making/ A&P ?  ?                        ?Medical Decision Making ?Amount and/or Complexity of Data Reviewed ?Labs: ordered. ?Radiology: ordered. ? ?Risk ?OTC drugs. ? ? ?Additional history obtained from significant other at bedside. ? ?Chart review shows pulmonology visit January 01, 2022. ? ?Cardiac monitoring shows sinus rhythm. ? ?Work-up included labs.  White count 11 chemistry unremarkable.  D-dimer unremarkable.  Troponin normal at 2. ? ?EKG shows sinus rhythm.  Q waves noted in the inferior lateral leads. ? ?Chest x-ray unremarkable for infiltrate effusion or edema. ? ?Given for work-up I doubt pulmonary embolism.  Patient has reproducible chest pain with palpation of the right chest wall.  I suspect musculoskeletal etiology.  Will recommend continued outpatient follow-up with her doctor this week.  Advised immediate return for worsening symptoms fevers or any additional concerns. ? ? ? ? ? ? ? ?Final Clinical Impression(s) / ED Diagnoses ?Final diagnoses:  ?Nonspecific chest pain  ? ? ?Rx / DC Orders ?ED Discharge Orders   ? ? None  ? ?  ? ? ?  ?Cheryll Cockayne, MD ?01/03/22 1024 ? ?

## 2022-01-03 NOTE — Discharge Instructions (Signed)
Call your primary care doctor or specialist as discussed in the next 2-3 days.   Return immediately back to the ER if:  Your symptoms worsen within the next 12-24 hours. You develop new symptoms such as new fevers, persistent vomiting, new pain, shortness of breath, or new weakness or numbness, or if you have any other concerns.  

## 2022-03-20 ENCOUNTER — Emergency Department (HOSPITAL_COMMUNITY): Payer: BC Managed Care – PPO

## 2022-03-20 ENCOUNTER — Emergency Department (HOSPITAL_COMMUNITY)
Admission: EM | Admit: 2022-03-20 | Discharge: 2022-03-20 | Disposition: A | Discharge status: Home / Self Care | Payer: BC Managed Care – PPO | Attending: Emergency Medicine | Admitting: Emergency Medicine

## 2022-03-20 ENCOUNTER — Encounter (HOSPITAL_COMMUNITY): Payer: Self-pay

## 2022-03-20 ENCOUNTER — Other Ambulatory Visit: Payer: Self-pay

## 2022-03-20 DIAGNOSIS — R040 Epistaxis: Secondary | ICD-10-CM | POA: Insufficient documentation

## 2022-03-20 DIAGNOSIS — R42 Dizziness and giddiness: Secondary | ICD-10-CM | POA: Insufficient documentation

## 2022-03-20 DIAGNOSIS — H538 Other visual disturbances: Secondary | ICD-10-CM | POA: Diagnosis not present

## 2022-03-20 DIAGNOSIS — G9050 Complex regional pain syndrome I, unspecified: Secondary | ICD-10-CM | POA: Insufficient documentation

## 2022-03-20 HISTORY — DX: Complex regional pain syndrome I, unspecified: G90.50

## 2022-03-20 LAB — COMPREHENSIVE METABOLIC PANEL
ALT: 24 U/L (ref 0–44)
AST: 16 U/L (ref 15–41)
Albumin: 4.2 g/dL (ref 3.5–5.0)
Alkaline Phosphatase: 57 U/L (ref 38–126)
Anion gap: 6 (ref 5–15)
BUN: 12 mg/dL (ref 6–20)
CO2: 21 mmol/L — ABNORMAL LOW (ref 22–32)
Calcium: 9.1 mg/dL (ref 8.9–10.3)
Chloride: 109 mmol/L (ref 98–111)
Creatinine, Ser: 0.97 mg/dL (ref 0.44–1.00)
GFR, Estimated: >60 mL/min (ref >60)
Glucose, Bld: 114 mg/dL — ABNORMAL HIGH (ref 70–99)
Potassium: 3.7 mmol/L (ref 3.5–5.1)
Sodium: 136 mmol/L (ref 135–145)
Total Bilirubin: 0.4 mg/dL (ref 0.3–1.2)
Total Protein: 7 g/dL (ref 6.5–8.1)

## 2022-03-20 LAB — CBC WITH DIFFERENTIAL/PLATELET
Abs Immature Granulocytes: 0.01 10*3/uL (ref 0.00–0.07)
Basophils Absolute: 0.1 10*3/uL (ref 0.0–0.1)
Basophils Relative: 2 %
Eosinophils Absolute: 0.1 10*3/uL (ref 0.0–0.5)
Eosinophils Relative: 1 %
HCT: 39.3 % (ref 36.0–46.0)
Hemoglobin: 12.6 g/dL (ref 12.0–15.0)
Immature Granulocytes: 0 %
Lymphocytes Relative: 36 %
Lymphs Abs: 2.8 10*3/uL (ref 0.7–4.0)
MCH: 27.7 pg (ref 26.0–34.0)
MCHC: 32.1 g/dL (ref 30.0–36.0)
MCV: 86.4 fL (ref 80.0–100.0)
Monocytes Absolute: 0.7 10*3/uL (ref 0.1–1.0)
Monocytes Relative: 9 %
Neutro Abs: 4 10*3/uL (ref 1.7–7.7)
Neutrophils Relative %: 52 %
Platelets: 325 10*3/uL (ref 150–400)
RBC: 4.55 MIL/uL (ref 3.87–5.11)
RDW: 14.6 % (ref 11.5–15.5)
WBC: 7.6 10*3/uL (ref 4.0–10.5)
nRBC: 0 % (ref 0.0–0.2)

## 2022-03-20 LAB — I-STAT BETA HCG BLOOD, ED (MC, WL, AP ONLY): I-stat hCG, quantitative: <5 m[IU]/mL (ref <5)

## 2022-03-20 MED ORDER — LACTATED RINGERS IV BOLUS
1000.0000 mL | Freq: Once | INTRAVENOUS | Status: AC
  Administered 2022-03-20: 1000 mL via INTRAVENOUS

## 2022-03-20 MED ORDER — ONDANSETRON HCL 4 MG/2ML IJ SOLN
4.0000 mg | Freq: Once | INTRAMUSCULAR | Status: AC
  Administered 2022-03-20: 4 mg via INTRAVENOUS
  Filled 2022-03-20: qty 2

## 2022-03-20 NOTE — ED Notes (Signed)
Patient requested IV fluids be stopped prior to finishing. States she has a child at home and needs to get home to child. Vitals updated at 0725. IV removed, intact. Patient reports no concerns prior to departure. Patient ambulated out of ED at 0730 with sign. Other.

## 2022-03-20 NOTE — ED Provider Notes (Signed)
[facility]-EMERGENCY DEPT  Provider Note  CSN: [id number] Arrival date & time: 03/20/22 6295  History Chief Complaint  Patient presents with   Dizziness    Joanna Zimmerman is a 36 y.o. female with history of migraines, complex regional pain syndrome in R arm and leg. She reports yesterday she was feeling nauseated, no vomiting but poor PO intake. She had two self-limited nosebleed. She reports some spasms in her RUE and RLE (which is not unusual). She had some blurry vision she describes as seeing white spots and then felt room spinning dizziness. She called her neurologists office and the nurse on call advised her to come in for evaluation. Denies headache today.    Home Medications Prior to Admission medications   Medication Sig Start Date End Date Taking? Authorizing Provider  albuterol (VENTOLIN HFA) 108 (90 Base) MCG/ACT inhaler Inhale into the lungs every 6 (six) hours as needed for wheezing or shortness of breath.    [provider]  cetirizine (ZYRTEC) 10 MG tablet Take 10 mg by mouth daily.    [provider]  EPINEPHrine 0.3 mg/0.3 mL IJ SOAJ injection Inject into the muscle once.    [provider]  fexofenadine (ALLEGRA) 180 MG tablet Take 180 mg by mouth daily.    [provider]  fluticasone (FLONASE) 50 MCG/ACT nasal spray Place 1 spray into both nostrils daily. 08/25/17   Caccavale, Sophia, PA-C  montelukast (SINGULAIR) 10 MG tablet Take 10 mg by mouth at bedtime.    [provider]  omeprazole (PRILOSEC) 40 MG capsule Take 40 mg by mouth daily.    [provider]     Allergies    Cefdinir, Penicillins, Baclofen, Doxycycline, Levetiracetam, Levofloxacin, Nifedipine, Relafen [nabumetone], Robaxin [methocarbamol], and Venlafaxine   Review of Systems   Review of Systems Please see HPI for pertinent positives and negatives  Physical Exam BP 119/70   Pulse (!) 48   Temp 98.1 F (36.7  C) (Oral)   Resp 15   Ht 5\' 7"  (1.702 m)   Wt 127 kg   SpO2 97%   BMI 43.85 kg/m   Physical Exam Vitals and nursing note reviewed.  Constitutional:      Appearance: Normal appearance.  HENT:     Head: Normocephalic and atraumatic.     Nose: Nose normal.     Mouth/Throat:     Mouth: Mucous membranes are moist.  Eyes:     Extraocular Movements: Extraocular movements intact.     Conjunctiva/sclera: Conjunctivae normal.     Comments: Grossly normal visual acuity  Cardiovascular:     Rate and Rhythm: Normal rate.  Pulmonary:     Effort: Pulmonary effort is normal.     Breath sounds: Normal breath sounds.  Abdominal:     General: Abdomen is flat.     Palpations: Abdomen is soft.     Tenderness: There is no abdominal tenderness.  Musculoskeletal:        General: No swelling. Normal range of motion.     Cervical back: Neck supple.  Skin:    General: Skin is warm and dry.  Neurological:     General: No focal deficit present.     Mental Status: She is alert and oriented to person, place, and time.     Cranial Nerves: No cranial nerve deficit.     Sensory: No sensory deficit.     Motor: No weakness.  Psychiatric:        Mood  and Affect: Mood normal.     ED Results / Procedures / Treatments   EKG None  Procedures Procedures  Medications Ordered in the ED Medications  lactated ringers bolus 1,000 mL (1,000 mLs Intravenous New Bag/Given 03/20/22 0637)  ondansetron (ZOFRAN) injection 4 mg (4 mg Intravenous Given 03/20/22 3149)    Initial Impression and Plan  Patient here with an unusual constellation of symptoms that does not fall under one obvious diagnosis. She has a benign exam now. Labs done in triage show normal CBC and CMP. I personally viewed the images from radiology studies and agree with radiologist interpretation: CT head is neg. She states she is still feeling nauseated and dehydrated, so will give a dose of zofran and IVF. Otherwise she is well appearing and  safe for discharge with outpatient Neurology follow up.    ED Course   Clinical Course as of 03/20/22 0649  Sat Mar 20, 2022  7026 Patient is getting her IVF, tolerating PO fluids well. Plan discharge when fluids are done.  [CS]    Clinical Course User Index [CS] Pollyann Savoy, MD     MDM Rules/Calculators/A&P Medical Decision Making Problems Addressed: Blurry vision: acute illness or injury Complex regional pain syndrome type 1, affecting unspecified site: chronic illness or injury Dizziness: acute illness or injury  Amount and/or Complexity of Data Reviewed Labs: ordered. Decision-making details documented in ED Course. Radiology: ordered and independent interpretation performed. Decision-making details documented in ED Course.  Risk Prescription drug management.    Final Clinical Impression(s) / ED Diagnoses Final diagnoses:  Dizziness  Blurry vision  Complex regional pain syndrome type 1, affecting unspecified site    Rx / DC Orders ED Discharge Orders     None        Pollyann Savoy, MD 03/20/22 [phone]

## 2022-03-20 NOTE — ED Triage Notes (Addendum)
Ambulatory to ED with c/o migraine with dizziness since ysterday morning. States she felt "really really really dehydrated" with a decreased appetite. States that around 11 pm tonight, she started getting blurry vision in both eyes but L > R. Called neurologist, told to come to ED.   Also states her skin feels like it's crawling and burning. Recently had gabapentin dose increased.

## 2022-04-07 ENCOUNTER — Emergency Department (HOSPITAL_BASED_OUTPATIENT_CLINIC_OR_DEPARTMENT_OTHER)
Admission: EM | Admit: 2022-04-07 | Discharge: 2022-04-07 | Disposition: A | Discharge status: Home / Self Care | Payer: BC Managed Care – PPO | Attending: Emergency Medicine | Admitting: Emergency Medicine

## 2022-04-07 ENCOUNTER — Encounter (HOSPITAL_BASED_OUTPATIENT_CLINIC_OR_DEPARTMENT_OTHER): Payer: Self-pay | Admitting: Emergency Medicine

## 2022-04-07 DIAGNOSIS — L0501 Pilonidal cyst with abscess: Secondary | ICD-10-CM

## 2022-04-07 MED ORDER — SULFAMETHOXAZOLE-TRIMETHOPRIM 800-160 MG PO TABS: 1.0000 | ORAL_TABLET | Freq: Two times a day (BID) | ORAL | 0 refills | Status: DC

## 2022-04-07 MED ORDER — LIDOCAINE-EPINEPHRINE (PF) 2 %-1:200000 IJ SOLN
20.0000 mL | Freq: Once | INTRAMUSCULAR | Status: AC
  Administered 2022-04-07: 20 mL
  Filled 2022-04-07: qty 20

## 2022-04-07 MED ORDER — SULFAMETHOXAZOLE-TRIMETHOPRIM 800-160 MG PO TABS: 1.0000 | ORAL_TABLET | Freq: Two times a day (BID) | ORAL | 0 refills | Status: AC

## 2022-04-07 NOTE — ED Provider Notes (Signed)
[facility] EMERGENCY DEPARTMENT Provider Note   CSN: [id number] Arrival date & time: 04/07/22  1606     History {Add pertinent medical, surgical, social history, OB history to HPI:1} Chief Complaint  Patient presents with   Abscess    Joanna Zimmerman is a 36 y.o. female.  She is here with acute tailbone pain and leakage states been going on for the last few days.  No fevers or chills.  Said she just had recent abdominal surgery which was a tubal ligation.  She does not think the 2 are related.  She said the drainage has been foul-smelling and now is bloody.  No prior history of same.  The history is provided by the patient.  Abscess Location:  Pelvis Pelvic abscess location:  Gluteal cleft Abscess quality: draining, fluctuance, painful and redness   Red streaking: no   Duration:  2 days Progression:  Unchanged Pain details:    Quality:  Throbbing   Severity:  Severe   Timing:  Constant   Progression:  Unchanged Chronicity:  New Relieved by:  Nothing Exacerbated by: Pressure. Ineffective treatments:  Warm water soaks Associated symptoms: no fever, no nausea and no vomiting   Risk factors: no prior abscess        Home Medications Prior to Admission medications   Medication Sig Start Date End Date Taking? Authorizing Provider  albuterol (VENTOLIN HFA) 108 (90 Base) MCG/ACT inhaler Inhale into the lungs every 6 (six) hours as needed for wheezing or shortness of breath.    [provider]  cetirizine (ZYRTEC) 10 MG tablet Take 10 mg by mouth daily.    [provider]  EPINEPHrine 0.3 mg/0.3 mL IJ SOAJ injection Inject into the muscle once.    [provider]  fexofenadine (ALLEGRA) 180 MG tablet Take 180 mg by mouth daily.    [provider]  fluticasone (FLONASE) 50 MCG/ACT nasal spray Place 1 spray into both nostrils daily. 08/25/17   Caccavale, Sophia, PA-C  montelukast (SINGULAIR) 10 MG tablet Take 10 mg by mouth at  bedtime.    [provider]  omeprazole (PRILOSEC) 40 MG capsule Take 40 mg by mouth daily.    [provider]      Allergies    Cefdinir, Penicillins, Baclofen, Doxycycline, Levetiracetam, Levofloxacin, Nifedipine, Relafen [nabumetone], Robaxin [methocarbamol], and Venlafaxine    Review of Systems   Review of Systems  Constitutional:  Negative for fever.  Gastrointestinal:  Negative for nausea and vomiting.  Skin:  Positive for wound.    Physical Exam Updated Vital Signs BP 92/68 (BP Location: Left Arm)   Pulse 95   Temp 98.4 F (36.9 C)   Resp 20   Ht 5\' 7"  (1.702 m)   Wt 117.9 kg   LMP 03/28/2022 (Exact Date)   SpO2 98%   BMI 40.72 kg/m  Physical Exam Vitals and nursing note reviewed.  Constitutional:      General: She is not in acute distress.    Appearance: Normal appearance. She is well-developed.  HENT:     Head: Normocephalic and atraumatic.  Eyes:     Conjunctiva/sclera: Conjunctivae normal.  Cardiovascular:     Rate and Rhythm: Normal rate and regular rhythm.     Heart sounds: No murmur heard. Pulmonary:     Effort: Pulmonary effort is normal. No respiratory distress.     Breath sounds: Normal breath sounds.  Abdominal:     Palpations: Abdomen is soft.     Tenderness: There  is no guarding.  Genitourinary:    Comments: At the top of her gluteal cleft she has a pit and some surrounding tenderness and fluctuance and erythema. Musculoskeletal:     Cervical back: Neck supple.  Skin:    General: Skin is warm and dry.     Capillary Refill: Capillary refill takes less than 2 seconds.  Neurological:     General: No focal deficit present.     Mental Status: She is alert.     ED Results / Procedures / Treatments   Labs (all labs ordered are listed, but only abnormal results are displayed) Labs Reviewed - No data to display  EKG None  Radiology No results found.  Procedures .Marland KitchenIncision and Drainage  Date/Time: 04/07/2022 7:32  PM  Performed by: Terrilee Files, MD Authorized by: Terrilee Files, MD   Consent:    Consent obtained:  Verbal   Consent given by:  Patient   Risks discussed:  Bleeding, incomplete drainage, pain and infection   Alternatives discussed:  No treatment, delayed treatment and referral Universal protocol:    Procedure explained and questions answered to patient or proxy's satisfaction: yes     Patient identity confirmed:  Verbally with patient Location:    Type:  Pilonidal cyst   Size:  3   Location:  Anogenital   Anogenital location:  Pilonidal Pre-procedure details:    Skin preparation:  Povidone-iodine Sedation:    Sedation type:  None Anesthesia:    Anesthesia method:  Local infiltration   Local anesthetic:  Lidocaine 2% WITH epi Procedure type:    Complexity:  Complex Procedure details:    Ultrasound guidance: no     Wound management:  Probed and deloculated   Drainage:  Purulent   Drainage amount:  Scant   Packing materials:  1/2 in iodoform gauze Post-procedure details:    Procedure completion:  Tolerated well, no immediate complications   {Document cardiac monitor, telemetry assessment procedure when appropriate:1}  Medications Ordered in ED Medications  lidocaine-EPINEPHrine (XYLOCAINE W/EPI) 2 %-1:200000 (PF) injection 20 mL (has no administration in time range)    ED Course/ Medical Decision Making/ A&P                           Medical Decision Making Risk Prescription drug management.     {Document critical care time when appropriate:1} {Document review of labs and clinical decision tools ie heart score, Chads2Vasc2 etc:1}  {Document your independent review of radiology images, and any outside records:1} {Document your discussion with family members, caretakers, and with consultants:1} {Document social determinants of health affecting pt's care:1} {Document your decision making why or why not admission, treatments were needed:1} Final Clinical  Impression(s) / ED Diagnoses Final diagnoses:  None    Rx / DC Orders ED Discharge Orders     None

## 2022-04-07 NOTE — Discharge Instructions (Signed)
You were seen in the emergency department for draining abscess.  This is likely a pilonidal cyst.  The area was cleaned and incised and a packing was placed.  This will need to be removed in 2 days.  You can do warm water soaks.  Tylenol as needed for pain.  We are putting you on antibiotics for possible infection.  Follow-up with your primary care doctor and dermatology as scheduled.  Return if any worsening or concerning symptoms

## 2022-04-07 NOTE — ED Triage Notes (Signed)
Abscess on tailbone. Noticed it yesterday.  Has appointment with derm in the AM. Denies injury, vomiting, fevers.  Endorses nausea from pain.  Active drainage and redness present.

## 2022-05-19 ENCOUNTER — Emergency Department (HOSPITAL_BASED_OUTPATIENT_CLINIC_OR_DEPARTMENT_OTHER)
Admission: EM | Admit: 2022-05-19 | Discharge: 2022-05-20 | Disposition: A | Discharge status: Home / Self Care | Payer: BC Managed Care – PPO | Attending: Emergency Medicine | Admitting: Emergency Medicine

## 2022-05-19 ENCOUNTER — Other Ambulatory Visit: Payer: Self-pay

## 2022-05-19 ENCOUNTER — Encounter (HOSPITAL_BASED_OUTPATIENT_CLINIC_OR_DEPARTMENT_OTHER): Payer: Self-pay | Admitting: Emergency Medicine

## 2022-05-19 DIAGNOSIS — Z87442 Personal history of urinary calculi: Secondary | ICD-10-CM | POA: Insufficient documentation

## 2022-05-19 DIAGNOSIS — R197 Diarrhea, unspecified: Secondary | ICD-10-CM | POA: Insufficient documentation

## 2022-05-19 DIAGNOSIS — R112 Nausea with vomiting, unspecified: Secondary | ICD-10-CM

## 2022-05-19 DIAGNOSIS — R109 Unspecified abdominal pain: Secondary | ICD-10-CM | POA: Diagnosis not present

## 2022-05-19 LAB — URINALYSIS, ROUTINE W REFLEX MICROSCOPIC
Bilirubin Urine: NEGATIVE
Glucose, UA: NEGATIVE mg/dL
Hgb urine dipstick: NEGATIVE
Ketones, ur: NEGATIVE mg/dL
Nitrite: NEGATIVE
Protein, ur: NEGATIVE mg/dL
Specific Gravity, Urine: 1.025 (ref 1.005–1.030)
pH: 5 (ref 5.0–8.0)

## 2022-05-19 LAB — COMPREHENSIVE METABOLIC PANEL
ALT: 31 U/L (ref 0–44)
AST: 23 U/L (ref 15–41)
Albumin: 4.6 g/dL (ref 3.5–5.0)
Alkaline Phosphatase: 75 U/L (ref 38–126)
Anion gap: 8 (ref 5–15)
BUN: 9 mg/dL (ref 6–20)
CO2: 24 mmol/L (ref 22–32)
Calcium: 10.1 mg/dL (ref 8.9–10.3)
Chloride: 107 mmol/L (ref 98–111)
Creatinine, Ser: 0.91 mg/dL (ref 0.44–1.00)
GFR, Estimated: >60 mL/min (ref >60)
Glucose, Bld: 90 mg/dL (ref 70–99)
Potassium: 3.7 mmol/L (ref 3.5–5.1)
Sodium: 139 mmol/L (ref 135–145)
Total Bilirubin: 0.6 mg/dL (ref 0.3–1.2)
Total Protein: 7.8 g/dL (ref 6.5–8.1)

## 2022-05-19 LAB — URINALYSIS, MICROSCOPIC (REFLEX): RBC / HPF: NONE SEEN RBC/hpf (ref 0–5)

## 2022-05-19 LAB — PREGNANCY, URINE: Preg Test, Ur: NEGATIVE

## 2022-05-19 LAB — LIPASE, BLOOD: Lipase: 45 U/L (ref 11–51)

## 2022-05-19 MED ORDER — IOHEXOL 9 MG/ML PO SOLN
500.0000 mL | ORAL | Status: AC
  Administered 2022-05-20 (×2): 500 mL via ORAL

## 2022-05-19 MED ORDER — LACTATED RINGERS IV BOLUS
1000.0000 mL | Freq: Once | INTRAVENOUS | Status: AC
  Administered 2022-05-20: 1000 mL via INTRAVENOUS

## 2022-05-19 MED ORDER — HYDROMORPHONE HCL 1 MG/ML IJ SOLN
1.0000 mg | Freq: Once | INTRAMUSCULAR | Status: AC
  Administered 2022-05-20: 1 mg via INTRAVENOUS
  Filled 2022-05-19: qty 1

## 2022-05-19 NOTE — ED Provider Notes (Signed)
[facility] EMERGENCY DEPARTMENT Provider Note   CSN: [id number] Arrival date & time: 05/19/22  1817     History  Chief Complaint  Patient presents with   Abdominal Pain    Joanna Zimmerman is a 36 y.o. female.  36 year old female who presents the ER today secondary to diarrhea and nausea.  Patient states her last or so she has had pretty consistent nonbloody diarrhea.  She states it is oily in nature.  She also has had pretty significant nausea and has had multiple episodes of nonbloody nonbilious emesis.  She is able to tolerate some things but not others.  She states that she is also been urinating a lot.  She has had no fevers.  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  She did have taquitos 4 days ago but they did not have any green peppers that she knows of.  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  She does not have a gallbladder but does have an appendix.  She never anything like this before.  She did have a tubal ligation back in July that was complicated by a surgical site infection which is improved.  No diagnosis of IBS.  She has been under a lot of stress and wonders if possibly could be some type of digestive ulcer.  Distant history of kidney stones.   Abdominal Pain      Home Medications Prior to Admission medications   Medication Sig Start Date End Date Taking? Authorizing Provider  dicyclomine (BENTYL) 20 MG tablet Take 1 tablet (20 mg total) by mouth 2 (two) times daily as needed for spasms (abdominal cramping). 05/20/22  Yes Xayla Puzio, Barbara Cower, MD  loperamide (IMODIUM) 2 MG capsule Take 1 capsule (2 mg total) by mouth 4 (four) times daily as needed for diarrhea or loose stools. 05/20/22  Yes Zaydrian Batta, Barbara Cower, MD  promethazine (PHENERGAN) 25 MG suppository Place 1 suppository (25 mg total) rectally every 6 (six) hours as needed for nausea or vomiting. 05/20/22  Yes Ella Guillotte, Barbara Cower, MD   promethazine (PHENERGAN) 25 MG tablet Take 1 tablet (25 mg total) by mouth every 6 (six) hours as needed for nausea or vomiting. 05/20/22  Yes Sostenes Kauffmann, Barbara Cower, MD  albuterol (VENTOLIN HFA) 108 (90 Base) MCG/ACT inhaler Inhale into the lungs every 6 (six) hours as needed for wheezing or shortness of breath.    [provider]  cetirizine (ZYRTEC) 10 MG tablet Take 10 mg by mouth daily.    [provider]  EPINEPHrine 0.3 mg/0.3 mL IJ SOAJ injection Inject into the muscle once.    [provider]  fexofenadine (ALLEGRA) 180 MG tablet Take 180 mg by mouth daily.    [provider]  fluticasone (FLONASE) 50 MCG/ACT nasal spray Place 1 spray into both nostrils daily. 08/25/17   Caccavale, Sophia, PA-C  montelukast (SINGULAIR) 10 MG tablet Take 10 mg by mouth at bedtime.    [provider]  omeprazole (PRILOSEC) 40 MG capsule Take 40 mg by mouth daily.    [provider]      Allergies    Cefdinir, Penicillins, Baclofen, Doxycycline, Levetiracetam, Levofloxacin, Nifedipine, Relafen [nabumetone], Robaxin [methocarbamol], and Venlafaxine    Review of Systems   Review of Systems  Gastrointestinal:  Positive for abdominal pain.    Physical Exam Updated Vital Signs BP (!) 131/96 (BP Location: Left Arm)   Pulse 97   Temp 97.7  F (36.5 C) (Oral)   Resp 18   Ht 5\' 7"  (1.702 m)   Wt 118 kg   SpO2 96%   BMI 40.74 kg/m  Physical Exam Vitals and nursing note reviewed.  Constitutional:      Appearance: She is well-developed.  HENT:     Head: Normocephalic and atraumatic.  Cardiovascular:     Rate and Rhythm: Normal rate and regular rhythm.  Pulmonary:     Effort: No respiratory distress.     Breath sounds: No stridor.  Abdominal:     General: There is no distension.     Tenderness: There is abdominal tenderness (Mostly in the right side/flank area).  Musculoskeletal:     Cervical back: Normal range of motion.  Skin:    Findings: No rash.   Neurological:     Mental Status: She is alert.     ED Results / Procedures / Treatments   Labs (all labs ordered are listed, but only abnormal results are displayed) Labs Reviewed  URINALYSIS, ROUTINE W REFLEX MICROSCOPIC - Abnormal; Notable for the following components:      Result Value   APPearance HAZY (*)    Leukocytes,Ua SMALL (*)    All other components within normal limits  URINALYSIS, MICROSCOPIC (REFLEX) - Abnormal; Notable for the following components:   Bacteria, UA FEW (*)    All other components within normal limits  URINE CULTURE  LIPASE, BLOOD  COMPREHENSIVE METABOLIC PANEL  PREGNANCY, URINE    EKG None  Radiology CT ABDOMEN PELVIS W CONTRAST  Result Date: 05/20/2022 CLINICAL DATA:  Abdominal pain. EXAM: CT ABDOMEN AND PELVIS WITH CONTRAST TECHNIQUE: Multidetector CT imaging of the abdomen and pelvis was performed using the standard protocol following bolus administration of intravenous contrast. RADIATION DOSE REDUCTION: This exam was performed according to the departmental dose-optimization program which includes automated exposure control, adjustment of the mA and/or kV according to patient size and/or use of iterative reconstruction technique. CONTRAST:  07/20/2022 OMNIPAQUE IOHEXOL 300 MG/ML  SOLN COMPARISON:  CT abdomen pelvis dated 06/30/2018. FINDINGS: Lower chest: Bibasilar linear atelectasis/scarring. No intra-abdominal free air or free fluid. Hepatobiliary: Fatty liver. No biliary dilatation. Cholecystectomy. No retained calcified stone noted in the central CBD. Pancreas: Unremarkable. No pancreatic ductal dilatation or surrounding inflammatory changes. Spleen: Normal in size without focal abnormality. Adrenals/Urinary Tract: The adrenal glands unremarkable. The kidneys, visualized ureters, and urinary bladder appear unremarkable. Stomach/Bowel: There is no bowel obstruction or active inflammation. The appendix is normal. Vascular/Lymphatic: The abdominal aorta and  IVC unremarkable. No portal venous gas. There is no adenopathy. Reproductive: The uterus is anteverted. An intrauterine device is noted which appears inferiorly displaced located in the region of the cervix. The stem of the IUD abuts the posterior wall of the upper vagina or cervical region with possible penetration. Recommend gynecology consult. A 2.5 cm right ovarian dominant follicle or corpus luteum. No follow-up. The left ovary is unremarkable. Other: None Musculoskeletal: No acute or significant osseous findings. IMPRESSION: 1. No acute intra-abdominal or pelvic pathology. No bowel obstruction. Normal appendix. 2. Fatty liver. 3. Inferiorly displaced intrauterine device. Recommend gynecology consult. Electronically Signed   By: 07/02/2018 M.D.   On: 05/20/2022 02:45    Procedures Procedures    Medications Ordered in ED Medications  lactated ringers bolus 1,000 mL ( Intravenous Stopped 05/20/22 0113)  HYDROmorphone (DILAUDID) injection 1 mg (1 mg Intravenous Given 05/20/22 0004)  iohexol (OMNIPAQUE) 9 MG/ML oral solution 500 mL (500 mLs Oral Contrast Given 05/20/22  0014)  ondansetron (ZOFRAN) injection 4 mg (4 mg Intravenous Given 05/20/22 0050)  prochlorperazine (COMPAZINE) injection 10 mg (10 mg Intravenous Given 05/20/22 0221)  iohexol (OMNIPAQUE) 300 MG/ML solution 125 mL (125 mLs Intravenous Contrast Given 05/20/22 0204)    ED Course/ Medical Decision Making/ A&P                           Medical Decision Making Amount and/or Complexity of Data Reviewed Labs: ordered. Radiology: ordered.  Risk Prescription drug management.  Evaluate for appendicitis versus kidney stone versus some other type of inflammatory condition.  Could just be a simple gastroenteritis is lasting longer than normal.  We will treat symptomatically for now.  Her urine shows bacteria and a few white blood cells will send for culture and thus her findings on CT scan that look concerning for cystitis. Ct reassuring.  Symptoms controlled. Possibly IBS vs the CRPS flare. Could still viral. Will treat symptomatically at home.   Final Clinical Impression(s) / ED Diagnoses Final diagnoses:  Nausea and vomiting, unspecified vomiting type    Rx / DC Orders ED Discharge Orders          Ordered    promethazine (PHENERGAN) 25 MG tablet  Every 6 hours PRN        05/20/22 0306    promethazine (PHENERGAN) 25 MG suppository  Every 6 hours PRN        05/20/22 0306    dicyclomine (BENTYL) 20 MG tablet  2 times daily PRN        05/20/22 0306    loperamide (IMODIUM) 2 MG capsule  4 times daily PRN        05/20/22 0306              Kevontae Burgoon, Barbara Cower, MD 05/20/22 9622

## 2022-05-19 NOTE — ED Triage Notes (Addendum)
Pt c/o right sided abdominal pain x 4 days. States pain is progressively worsened and she is unable to tolerate po intake. Describes pain as burning. Has complex hx of abdominal issues. Had recent abdominal surgery x 1 mnth ago. Pt left AMA from [facility]. HX OF CRPS.

## 2022-05-19 NOTE — ED Notes (Signed)
ED Provider at bedside. 

## 2022-05-20 ENCOUNTER — Emergency Department (HOSPITAL_BASED_OUTPATIENT_CLINIC_OR_DEPARTMENT_OTHER): Payer: BC Managed Care – PPO

## 2022-05-20 MED ORDER — PROMETHAZINE HCL 25 MG RE SUPP
25.0000 mg | Freq: Four times a day (QID) | RECTAL | 0 refills | Status: DC | PRN
Start: 2022-05-20 — End: 2023-01-27

## 2022-05-20 MED ORDER — DICYCLOMINE HCL 20 MG PO TABS: 20.0000 mg | ORAL_TABLET | Freq: Two times a day (BID) | ORAL | 0 refills | Status: AC | PRN

## 2022-05-20 MED ORDER — LOPERAMIDE HCL 2 MG PO CAPS: 2.0000 mg | ORAL_CAPSULE | Freq: Four times a day (QID) | ORAL | 0 refills | Status: DC | PRN

## 2022-05-20 MED ORDER — PROCHLORPERAZINE EDISYLATE 10 MG/2ML IJ SOLN
10.0000 mg | Freq: Once | INTRAMUSCULAR | Status: AC
  Administered 2022-05-20: 10 mg via INTRAVENOUS
  Filled 2022-05-20: qty 2

## 2022-05-20 MED ORDER — ONDANSETRON HCL 4 MG/2ML IJ SOLN
4.0000 mg | Freq: Once | INTRAMUSCULAR | Status: AC
  Administered 2022-05-20: 4 mg via INTRAVENOUS
  Filled 2022-05-20: qty 2

## 2022-05-20 MED ORDER — IOHEXOL 300 MG/ML  SOLN
125.0000 mL | Freq: Once | INTRAMUSCULAR | Status: AC | PRN
  Administered 2022-05-20: 125 mL via INTRAVENOUS

## 2022-05-20 MED ORDER — PROMETHAZINE HCL 25 MG PO TABS
25.0000 mg | ORAL_TABLET | Freq: Four times a day (QID) | ORAL | 0 refills | Status: DC | PRN
Start: 2022-05-20 — End: 2023-01-27

## 2022-05-20 NOTE — ED Notes (Signed)
Patient transported to CT 

## 2022-05-20 NOTE — ED Notes (Signed)
Pt continues to have vomiting  Unable to hold CT contrast down

## 2022-05-21 LAB — URINE CULTURE: Culture: >=100000 — AB

## 2022-05-22 ENCOUNTER — Telehealth: Payer: Self-pay | Admitting: Emergency Medicine

## 2022-05-22 NOTE — Telephone Encounter (Signed)
Post ED Visit - Positive Culture Follow-up  Culture report reviewed by antimicrobial stewardship pharmacist: [facility] []  , Pharm.D. []  Enzo Bi, Pharm.D., BCPS AQ-ID []  , Pharm.D., BCPS []  Celedonio Miyamoto, Pharm.D., BCPS []  [place], Garvin Fila.D., BCPS, AAHIVP []  , Pharm.D., BCPS, AAHIVP []  Georgina Pillion, PharmD, BCPS []  , PharmD, BCPS []  [place], PharmD, BCPS []  [address], PharmD []  , PharmD, BCPS []  Estella Husk, PharmD  Pharmacy Team []  Lysle Pearl, PharmD []  , PharmD []  Phillips Climes, PharmD []  , Rph []  Agapito Games) , PharmD []  Verlan Friends, PharmD []  , PharmD []  Mervyn Gay, PharmD []  , PharmD []  Vinnie Level, PharmD []  [facility], PharmD []  , PharmD []  Len Childs, PharmD   Positive urine culture Treated with none, asymptomatic, and no further patient follow-up is required at this time.  05/22/2022, 1:40 PM

## 2022-07-08 ENCOUNTER — Other Ambulatory Visit: Payer: Self-pay | Admitting: Gastroenterology

## 2022-07-09 ENCOUNTER — Encounter (HOSPITAL_COMMUNITY)
Admission: RE | Disposition: A | Discharge status: Home / Self Care | Payer: Self-pay | Source: Home / Self Care | Attending: Gastroenterology

## 2022-07-09 ENCOUNTER — Other Ambulatory Visit: Payer: Self-pay

## 2022-07-09 ENCOUNTER — Ambulatory Visit (HOSPITAL_COMMUNITY): Payer: BC Managed Care – PPO | Admitting: Anesthesiology

## 2022-07-09 ENCOUNTER — Ambulatory Visit (HOSPITAL_COMMUNITY)
Admission: RE | Admit: 2022-07-09 | Discharge: 2022-07-09 | Disposition: A | Discharge status: Home / Self Care | Payer: BC Managed Care – PPO | Attending: Gastroenterology | Admitting: Gastroenterology

## 2022-07-09 ENCOUNTER — Encounter (HOSPITAL_COMMUNITY): Payer: Self-pay | Admitting: Gastroenterology

## 2022-07-09 DIAGNOSIS — R197 Diarrhea, unspecified: Secondary | ICD-10-CM | POA: Diagnosis present

## 2022-07-09 DIAGNOSIS — R6881 Early satiety: Secondary | ICD-10-CM | POA: Diagnosis not present

## 2022-07-09 DIAGNOSIS — K640 First degree hemorrhoids: Secondary | ICD-10-CM | POA: Diagnosis not present

## 2022-07-09 DIAGNOSIS — K2900 Acute gastritis without bleeding: Secondary | ICD-10-CM | POA: Insufficient documentation

## 2022-07-09 DIAGNOSIS — Q438 Other specified congenital malformations of intestine: Secondary | ICD-10-CM | POA: Diagnosis not present

## 2022-07-09 DIAGNOSIS — K644 Residual hemorrhoidal skin tags: Secondary | ICD-10-CM | POA: Diagnosis not present

## 2022-07-09 DIAGNOSIS — R110 Nausea: Secondary | ICD-10-CM | POA: Diagnosis not present

## 2022-07-09 DIAGNOSIS — K2289 Other specified disease of esophagus: Secondary | ICD-10-CM | POA: Insufficient documentation

## 2022-07-09 DIAGNOSIS — R1314 Dysphagia, pharyngoesophageal phase: Secondary | ICD-10-CM | POA: Insufficient documentation

## 2022-07-09 DIAGNOSIS — K625 Hemorrhage of anus and rectum: Secondary | ICD-10-CM | POA: Insufficient documentation

## 2022-07-09 DIAGNOSIS — Z87891 Personal history of nicotine dependence: Secondary | ICD-10-CM | POA: Insufficient documentation

## 2022-07-09 DIAGNOSIS — J449 Chronic obstructive pulmonary disease, unspecified: Secondary | ICD-10-CM | POA: Insufficient documentation

## 2022-07-09 DIAGNOSIS — R1013 Epigastric pain: Secondary | ICD-10-CM | POA: Diagnosis not present

## 2022-07-09 DIAGNOSIS — Z79899 Other long term (current) drug therapy: Secondary | ICD-10-CM | POA: Insufficient documentation

## 2022-07-09 DIAGNOSIS — R1310 Dysphagia, unspecified: Secondary | ICD-10-CM

## 2022-07-09 HISTORY — PX: BIOPSY: SHX5522

## 2022-07-09 HISTORY — PX: ESOPHAGOGASTRODUODENOSCOPY (EGD) WITH PROPOFOL: SHX5813

## 2022-07-09 HISTORY — PX: COLONOSCOPY WITH PROPOFOL: SHX5780

## 2022-07-09 SURGERY — COLONOSCOPY WITH PROPOFOL
Anesthesia: Monitor Anesthesia Care

## 2022-07-09 MED ORDER — PROPOFOL 500 MG/50ML IV EMUL
INTRAVENOUS | Status: DC | PRN
  Administered 2022-07-09: 150 ug/kg/min via INTRAVENOUS

## 2022-07-09 MED ORDER — PROPOFOL 10 MG/ML IV BOLUS
INTRAVENOUS | Status: DC | PRN
  Administered 2022-07-09 (×2): 20 mg via INTRAVENOUS
  Administered 2022-07-09: 50 mg via INTRAVENOUS

## 2022-07-09 MED ORDER — LACTATED RINGERS IV SOLN
INTRAVENOUS | Status: DC
  Administered 2022-07-09: 1000 mL via INTRAVENOUS

## 2022-07-09 MED ORDER — KETAMINE HCL 10 MG/ML IJ SOLN
INTRAMUSCULAR | Status: AC
  Filled 2022-07-09: qty 1

## 2022-07-09 MED ORDER — KETAMINE HCL 10 MG/ML IJ SOLN
INTRAMUSCULAR | Status: DC | PRN
  Administered 2022-07-09: 30 mg via INTRAVENOUS

## 2022-07-09 MED ORDER — DEXMEDETOMIDINE HCL IN NACL 200 MCG/50ML IV SOLN
INTRAVENOUS | Status: DC | PRN
  Administered 2022-07-09: 20 ug via INTRAVENOUS

## 2022-07-09 MED ORDER — SODIUM CHLORIDE 0.9 % IV SOLN: INTRAVENOUS | Status: DC

## 2022-07-09 MED ORDER — LIDOCAINE 2% (20 MG/ML) 5 ML SYRINGE
INTRAMUSCULAR | Status: DC | PRN
  Administered 2022-07-09: 100 mg via INTRAVENOUS

## 2022-07-09 SURGICAL SUPPLY — 25 items

## 2022-07-09 NOTE — H&P (Signed)
Date of Initial H&P: 06/15/22  History reviewed, patient examined, no change in status, stable for surgery.   

## 2022-07-09 NOTE — Interval H&P Note (Signed)
History and Physical Interval Note:  07/09/2022 10:29 AM  Joanna Zimmerman  has presented today for surgery, with the diagnosis of Blood per Rectum, Pain in Upper Abdomen.  The various methods of treatment have been discussed with the patient and family. After consideration of risks, benefits and other options for treatment, the patient has consented to  Procedure(s) with comments: COLONOSCOPY WITH PROPOFOL (N/A) ESOPHAGOGASTRODUODENOSCOPY (EGD) WITH PROPOFOL (N/A) - With possible Dilation as a surgical intervention.  The patient's history has been reviewed, patient examined, no change in status, stable for surgery.  I have reviewed the patient's chart and labs.  Questions were answered to the patient's satisfaction.     Lear Ng

## 2022-07-09 NOTE — Discharge Instructions (Signed)

## 2022-07-09 NOTE — Anesthesia Preprocedure Evaluation (Signed)
Anesthesia Evaluation  Patient identified by MRN, date of birth, ID band Patient awake    Reviewed: Allergy & Precautions, NPO status , Patient's Chart, lab work & pertinent test results  Airway Mallampati: II  TM Distance: >3 FB Neck ROM: Full    Dental no notable dental hx.    Pulmonary COPD, former smoker,    Pulmonary exam normal breath sounds clear to auscultation       Cardiovascular negative cardio ROS Normal cardiovascular exam Rhythm:Regular Rate:Normal     Neuro/Psych Anxiety negative neurological ROS  negative psych ROS   GI/Hepatic negative GI ROS, Neg liver ROS,   Endo/Other  PCOS  Renal/GU negative Renal ROS  negative genitourinary   Musculoskeletal negative musculoskeletal ROS (+)   Abdominal   Peds negative pediatric ROS (+)  Hematology negative hematology ROS (+)   Anesthesia Other Findings   Reproductive/Obstetrics negative OB ROS                             Anesthesia Physical Anesthesia Plan  ASA: 2  Anesthesia Plan: MAC   Post-op Pain Management:    Induction: Intravenous  PONV Risk Score and Plan: 2 and Propofol infusion and Treatment may vary due to age or medical condition  Airway Management Planned: Simple Face Mask  Additional Equipment:   Intra-op Plan:   Post-operative Plan:   Informed Consent: I have reviewed the patients History and Physical, chart, labs and discussed the procedure including the risks, benefits and alternatives for the proposed anesthesia with the patient or authorized representative who has indicated his/her understanding and acceptance.     Dental advisory given  Plan Discussed with: CRNA and Surgeon  Anesthesia Plan Comments:         Anesthesia Quick Evaluation

## 2022-07-09 NOTE — Op Note (Signed)
[facility] Patient Name: Joanna Zimmerman Procedure Date: 07/09/2022 MRN: [id number] Attending MD: Lear Ng , MD, [id number] Date of Birth: [birth year] CSN: [id number] Age: 36 Admit Type: Outpatient Procedure:                Upper GI endoscopy Indications:              Epigastric abdominal pain, Dysphagia, Nausea Providers:                Lear Ng, MD, Dulcy Fanny, Darliss Cheney, Technician, Cleda Daub, CRNA Referring MD:              Medicines:                Propofol per Anesthesia, Monitored Anesthesia Care Complications:            No immediate complications. Estimated Blood Loss:     Estimated blood loss was minimal. Procedure:                Pre-Anesthesia Assessment:                           - Prior to the procedure, a History and Physical                            was performed, and patient medications and                            allergies were reviewed. The patient's tolerance of                            previous anesthesia was also reviewed. The risks                            and benefits of the procedure and the sedation                            options and risks were discussed with the patient.                            All questions were answered, and informed consent                            was obtained. Prior Anticoagulants: The patient has                            taken no anticoagulant or antiplatelet agents. ASA                            Grade Assessment: III - A patient with severe                            systemic disease. After reviewing the risks and  benefits, the patient was deemed in satisfactory                            condition to undergo the procedure.                           After obtaining informed consent, the endoscope was                            passed under direct vision. Throughout the                            procedure, the  patient's blood pressure, pulse, and                            oxygen saturations were monitored continuously. The                            [id number]-[id number] ([id number]) Olympus endoscope was introduced                            through the mouth, and advanced to the second part                            of duodenum. The upper GI endoscopy was                            accomplished without difficulty. The patient                            tolerated the procedure. Scope In: Scope Out: Findings:      Mucosal changes including longitudinal furrows were found in the distal       esophagus. Biopsies were taken with a cold forceps for histology.       Estimated blood loss was minimal.      The Z-line was regular and was found 40 cm from the incisors.      Segmental mild inflammation characterized by congestion (edema) and       erythema was found in the gastric antrum. Biopsies were taken with a       cold forceps for histology. Estimated blood loss was minimal.      The cardia and gastric fundus were normal on retroflexion.      The examined duodenum was normal. Biopsies for histology were taken with       a cold forceps for evaluation of celiac disease. Estimated blood loss       was minimal. Impression:               - Esophageal mucosal changes suspicious for                            eosinophilic esophagitis. Biopsied.                           - Z-line regular, 40 cm from the incisors.                           -  Acute gastritis. Biopsied.                           - Normal examined duodenum. Biopsied. Moderate Sedation:      N/A - MAC procedure Recommendation:           - Patient has a contact number available for                            emergencies. The signs and symptoms of potential                            delayed complications were discussed with the                            patient. Return to normal activities tomorrow.                            Written discharge  instructions were provided to the                            patient.                           - Follow an antireflux regimen.                           - Await pathology results. Procedure Code(s):        --- Professional ---                           [phone], Esophagogastroduodenoscopy, flexible,                            transoral; with biopsy, single or multiple Diagnosis Code(s):        --- Professional ---                           R13.10, Dysphagia, unspecified                           R10.13, Epigastric pain                           K29.00, Acute gastritis without bleeding                           K22.89, Other specified disease of esophagus                           R11.0, Nausea CPT copyright 2022 [employer]. All rights reserved. The codes documented in this report are preliminary and upon coder review may  be revised to meet current compliance requirements. Lear Ng, MD 07/09/2022 11:39:19 AM This report has been signed electronically. Number of Addenda: 0

## 2022-07-09 NOTE — Transfer of Care (Signed)
Immediate Anesthesia Transfer of Care Note  Patient: Cella Cappello  Procedure(s) Performed: COLONOSCOPY WITH PROPOFOL ESOPHAGOGASTRODUODENOSCOPY (EGD) WITH PROPOFOL BIOPSY  Patient Location: PACU  Anesthesia Type:MAC  Level of Consciousness: awake, alert , oriented and patient cooperative  Airway & Oxygen Therapy: Patient Spontanous Breathing and Patient connected to face mask oxygen  Post-op Assessment: Report given to RN and Post -op Vital signs reviewed and stable  Post vital signs: Reviewed and stable  Last Vitals:  Vitals Value Taken Time  BP    Temp    Pulse    Resp    SpO2      Last Pain:  Vitals:   07/09/22 0908  TempSrc: Oral  PainSc: 0-No pain         Complications: No notable events documented.

## 2022-07-09 NOTE — Op Note (Signed)
[facility] Patient Name: Joanna Zimmerman Procedure Date: 07/09/2022 MRN: [id number] Attending MD: Lear Ng , MD, [id number] Date of Birth: July 12, 1986 CSN: [id number] Age: 36 Admit Type: Outpatient Procedure:                Colonoscopy Indications:              This is the patient's first colonoscopy, Diarrhea,                            Rectal bleeding Providers:                Lear Ng, MD, Dulcy Fanny, Darliss Cheney, Technician, Cleda Daub, CRNA Referring MD:              Medicines:                Propofol per Anesthesia, Monitored Anesthesia Care Complications:            No immediate complications. Estimated Blood Loss:     Estimated blood loss was minimal. Procedure:                Pre-Anesthesia Assessment:                           - Prior to the procedure, a History and Physical                            was performed, and patient medications and                            allergies were reviewed. The patient's tolerance of                            previous anesthesia was also reviewed. The risks                            and benefits of the procedure and the sedation                            options and risks were discussed with the patient.                            All questions were answered, and informed consent                            was obtained. Prior Anticoagulants: The patient has                            taken no anticoagulant or antiplatelet agents. ASA                            Grade Assessment: III - A patient with severe  systemic disease. After reviewing the risks and                            benefits, the patient was deemed in satisfactory                            condition to undergo the procedure.                           After obtaining informed consent, the colonoscope                            was passed under direct vision. Throughout the                             procedure, the patient's blood pressure, pulse, and                            oxygen saturations were monitored continuously. The                            PCF-HQ190L ([id number] Olympus colonoscope was                            introduced through the anus and advanced to the the                            cecum, identified by appendiceal orifice and                            ileocecal valve. The colonoscopy was performed with                            difficulty due to significant looping. Successful                            completion of the procedure was aided by                            straightening and shortening the scope to obtain                            bowel loop reduction, using scope torsion and                            applying abdominal pressure. The patient tolerated                            the procedure well. The quality of the bowel                            preparation was adequate and good. The terminal  ileum, ileocecal valve, appendiceal orifice, and                            rectum were photographed. Scope In: 11:04:16 AM Scope Out: 11:23:56 AM Total Procedure Duration: 0 hours 19 minutes 40 seconds  Findings:      The perianal exam findings include non-thrombosed external hemorrhoids.      Normal mucosa was found in the entire colon. Biopsies for histology were       taken with a cold forceps from the entire colon for evaluation of       microscopic colitis. Estimated blood loss was minimal.      Internal hemorrhoids were found during retroflexion. The hemorrhoids       were medium-sized and Grade I (internal hemorrhoids that do not       prolapse).      The terminal ileum appeared normal. Impression:               - Non-thrombosed external hemorrhoids found on                            perianal exam.                           - Normal mucosa in the entire examined colon.                             Biopsied.                           - Internal hemorrhoids.                           - The examined portion of the ileum was normal. Moderate Sedation:      N/A - MAC procedure Recommendation:           - Patient has a contact number available for                            emergencies. The signs and symptoms of potential                            delayed complications were discussed with the                            patient. Return to normal activities tomorrow.                            Written discharge instructions were provided to the                            patient.                           - High fiber diet.                           - Await pathology results.                           -  Repeat colonoscopy for surveillance based on                            pathology results. Procedure Code(s):        --- Professional ---                           [phone], Colonoscopy, flexible; with biopsy, single                            or multiple Diagnosis Code(s):        --- Professional ---                           R19.7, Diarrhea, unspecified                           K62.5, Hemorrhage of anus and rectum                           K64.0, First degree hemorrhoids                           K64.4, Residual hemorrhoidal skin tags CPT copyright 2022 American Medical Association. All rights reserved. The codes documented in this report are preliminary and upon coder review may  be revised to meet current compliance requirements. Lear Ng, MD 07/09/2022 11:45:27 AM This report has been signed electronically. Number of Addenda: 0

## 2022-07-11 ENCOUNTER — Encounter (HOSPITAL_COMMUNITY): Payer: Self-pay | Admitting: Gastroenterology

## 2022-07-12 NOTE — Anesthesia Postprocedure Evaluation (Signed)
Anesthesia Post Note  Patient: Joanna Zimmerman  Procedure(s) Performed: COLONOSCOPY WITH PROPOFOL ESOPHAGOGASTRODUODENOSCOPY (EGD) WITH PROPOFOL BIOPSY     Patient location during evaluation: PACU Anesthesia Type: MAC Level of consciousness: awake and alert Pain management: pain level controlled Vital Signs Assessment: post-procedure vital signs reviewed and stable Respiratory status: spontaneous breathing, nonlabored ventilation, respiratory function stable and patient connected to nasal cannula oxygen Cardiovascular status: stable and blood pressure returned to baseline Postop Assessment: no apparent nausea or vomiting Anesthetic complications: no   No notable events documented.  Last Vitals:  Vitals:   07/09/22 1140 07/09/22 1150  BP: 120/75   Pulse: 86 85  Resp: 18 15  Temp:    SpO2: 98% 94%    Last Pain:  Vitals:   07/09/22 1150  TempSrc:   PainSc: 4                  Karryn Kosinski S

## 2022-07-13 LAB — SURGICAL PATHOLOGY

## 2022-08-16 ENCOUNTER — Other Ambulatory Visit (HOSPITAL_COMMUNITY): Payer: Self-pay | Admitting: Internal Medicine

## 2022-08-16 DIAGNOSIS — R140 Abdominal distension (gaseous): Secondary | ICD-10-CM

## 2022-08-16 DIAGNOSIS — K2960 Other gastritis without bleeding: Secondary | ICD-10-CM

## 2022-08-16 DIAGNOSIS — R194 Change in bowel habit: Secondary | ICD-10-CM

## 2022-08-16 DIAGNOSIS — R1010 Upper abdominal pain, unspecified: Secondary | ICD-10-CM

## 2022-08-16 DIAGNOSIS — R110 Nausea: Secondary | ICD-10-CM

## 2022-08-16 DIAGNOSIS — R6881 Early satiety: Secondary | ICD-10-CM

## 2022-08-17 ENCOUNTER — Encounter: Payer: Self-pay | Admitting: Internal Medicine

## 2022-08-17 ENCOUNTER — Other Ambulatory Visit: Payer: Self-pay | Admitting: Internal Medicine

## 2022-08-27 ENCOUNTER — Encounter (HOSPITAL_COMMUNITY): Payer: BC Managed Care – PPO

## 2022-08-31 ENCOUNTER — Encounter (HOSPITAL_COMMUNITY): Payer: Self-pay

## 2022-08-31 ENCOUNTER — Encounter (HOSPITAL_COMMUNITY): Payer: BC Managed Care – PPO

## 2022-09-24 ENCOUNTER — Encounter (HOSPITAL_COMMUNITY): Payer: Self-pay

## 2022-09-24 ENCOUNTER — Encounter (HOSPITAL_COMMUNITY): Payer: BC Managed Care – PPO

## 2023-01-07 NOTE — Progress Notes (Signed)
Called patient regarding this appointment. Patient stated that she was not made aware of this appointment til now. She wishes for me to cancel this appointment as she is unable to keep it at this time and she will reach out to dr. Earlie Counts office regarding next steps.

## 2023-01-12 ENCOUNTER — Ambulatory Visit (HOSPITAL_COMMUNITY)
Admission: RE | Admit: 2023-01-12 | Payer: BC Managed Care – PPO | Source: Home / Self Care | Admitting: Internal Medicine

## 2023-01-12 ENCOUNTER — Encounter (HOSPITAL_COMMUNITY): Admission: RE | Payer: Self-pay | Source: Home / Self Care

## 2023-01-12 SURGERY — MANOMETRY, ANORECTAL
Anesthesia: Moderate Sedation

## 2023-01-25 ENCOUNTER — Other Ambulatory Visit: Payer: Self-pay | Admitting: Neurosurgery

## 2023-01-27 NOTE — Pre-Procedure Instructions (Signed)
Surgical Instructions    Your procedure is scheduled on Feb 01, 2023.  Report to [facility] Main Entrance "A" at 10:40 A.M., then check in with the Admitting office.  Call this number if you have problems the morning of surgery:  [phone]  If you have any questions prior to your surgery date call [phone]: Open Monday-Friday 8am-4pm If you experience any cold or flu symptoms such as cough, fever, chills, shortness of breath, etc. between now and your scheduled surgery, please notify us at the above number.     Remember:  Do not eat after midnight the night before your surgery  You may drink clear liquids until 9:40 AM the morning of your surgery.   Clear liquids allowed are: Water, Non-Citrus Juices (without pulp), Carbonated Beverages, Clear Tea, Black Coffee Only (NO MILK, CREAM OR POWDERED CREAMER of any kind), and Gatorade.     Take these medicines the morning of surgery with A SIP OF WATER:  desvenlafaxine (PRISTIQ)    gabapentin (NEURONTIN)   HYDROcodone-acetaminophen (NORCO)   hydrOXYzine (VISTARIL)   labetalol (NORMODYNE)   norethindrone-ethinyl estradiol (LOESTRIN)   omeprazole (PRILOSEC)   sulfamethoxazole-trimethoprim (BACTRIM DS)   tiZANidine (ZANAFLEX)    May take these medicines IF NEEDED:  albuterol (VENTOLIN HFA) inhaler   dicyclomine (BENTYL)   EPINEPHrine   ipratropium-albuterol (DUONEB) nebulizer  LORazepam (ATIVAN)   ondansetron (ZOFRAN)    As of today, STOP taking any Aspirin (unless otherwise instructed by your surgeon) Aleve, Naproxen, Ibuprofen, Motrin, Advil, Goody's, BC's, all herbal medications, fish oil, and all vitamins.                     Do NOT Smoke (Tobacco/Vaping) for 24 hours prior to your procedure.  If you use a CPAP at night, you may bring your mask/headgear for your overnight stay.   Contacts, glasses, piercing's, hearing aid's, dentures or partials may not be worn into surgery, please bring cases for these belongings.     For patients admitted to the hospital, discharge time will be determined by your treatment team.   Patients discharged the day of surgery will not be allowed to drive home, and someone needs to stay with them for 24 hours.  SURGICAL WAITING ROOM VISITATION Patients having surgery or a procedure may have no more than 2 support people in the waiting area - these visitors may rotate.   Children under the age of 15 must have an adult with them who is not the patient. If the patient needs to stay at the hospital during part of their recovery, the visitor guidelines for inpatient rooms apply. Pre-op nurse will coordinate an appropriate time for 1 support person to accompany patient in pre-op.  This support person may not rotate.   Please refer to the [facility] website for the visitor guidelines for Inpatients (after your surgery is over and you are in a regular room).    If you received a COVID test during your pre-op visit  it is requested that you wear a mask when out in public, stay away from anyone that may not be feeling well and notify your surgeon if you develop symptoms. If you have been in contact with anyone that has tested positive in the last 10 days please notify you surgeon.    Pre-operative 5 CHG Bath Instructions   You can play a key role in reducing the risk of infection after surgery. Your skin needs to be as free of germs as possible. You  can reduce the number of germs on your skin by washing with CHG (chlorhexidine gluconate) soap before surgery. CHG is an antiseptic soap that kills germs and continues to kill germs even after washing.   DO NOT use if you have an allergy to chlorhexidine/CHG or antibacterial soaps. If your skin becomes reddened or irritated, stop using the CHG and notify one of our RNs at [phone].   Please shower with the CHG soap starting 4 days before surgery using the following schedule:     Please keep in mind the following:  DO NOT shave,  including legs and underarms, starting the day of your first shower.   You may shave your face at any point before/day of surgery.  Place clean sheets on your bed the day you start using CHG soap. Use a clean washcloth (not used since being washed) for each shower. DO NOT sleep with pets once you start using the CHG.   CHG Shower Instructions:  If you choose to wash your hair and private area, wash first with your normal shampoo/soap.  After you use shampoo/soap, rinse your hair and body thoroughly to remove shampoo/soap residue.  Turn the water OFF and apply about 3 tablespoons (45 ml) of CHG soap to a CLEAN washcloth.  Apply CHG soap ONLY FROM YOUR NECK DOWN TO YOUR TOES (washing for 3-5 minutes)  DO NOT use CHG soap on face, private areas, open wounds, or sores.  Pay special attention to the area where your surgery is being performed.  If you are having back surgery, having someone wash your back for you may be helpful. Wait 2 minutes after CHG soap is applied, then you may rinse off the CHG soap.  Pat dry with a clean towel  Put on clean clothes/pajamas   If you choose to wear lotion, please use ONLY the CHG-compatible lotions on the back of this paper.     Additional instructions for the day of surgery: DO NOT APPLY any lotions, deodorants, cologne, or perfumes.   Put on clean/comfortable clothes.  Brush your teeth.  Ask your nurse before applying any prescription medications to the skin. Do not wear jewelry or makeup Do not shave 48 hours prior to surgery.  Men may shave face and neck. Do not bring valuables to the hospital.  Do not wear nail polish, gel polish, artificial nails, or any other type of covering on natural nails (fingers and toes)       CHG Compatible Lotions   Aveeno Moisturizing lotion  Cetaphil Moisturizing Cream  Cetaphil Moisturizing Lotion  Clairol Herbal Essence Moisturizing Lotion, Dry Skin  Clairol Herbal Essence Moisturizing Lotion, Extra Dry  Skin  Clairol Herbal Essence Moisturizing Lotion, Normal Skin  Curel Age Defying Therapeutic Moisturizing Lotion with Alpha Hydroxy  Curel Extreme Care Body Lotion  Curel Soothing Hands Moisturizing Hand Lotion  Curel Therapeutic Moisturizing Cream, Fragrance-Free  Curel Therapeutic Moisturizing Lotion, Fragrance-Free  Curel Therapeutic Moisturizing Lotion, Original Formula  Eucerin Daily Replenishing Lotion  Eucerin Dry Skin Therapy Plus Alpha Hydroxy Crme  Eucerin Dry Skin Therapy Plus Alpha Hydroxy Lotion  Eucerin Original Crme  Eucerin Original Lotion  Eucerin Plus Crme Eucerin Plus Lotion  Eucerin TriLipid Replenishing Lotion  Keri Anti-Bacterial Hand Lotion  Keri Deep Conditioning Original Lotion Dry Skin Formula Softly Scented  Keri Deep Conditioning Original Lotion, Fragrance Free Sensitive Skin Formula  Keri Lotion Fast Absorbing Fragrance Free Sensitive Skin Formula  Keri Lotion Fast Absorbing Softly Scented Dry Skin Formula  Keri Original  Lotion  [employer] Skin Renewal Lotion Keri Silky Smooth Lotion  Keri Silky Smooth Sensitive Skin Lotion  Nivea Body Creamy Conditioning Oil  Nivea Body Extra Enriched Teacher, adult education Moisturizing Lotion Nivea Crme  Nivea Skin Firming Lotion  NutraDerm 30 Skin Lotion  NutraDerm Skin Lotion  NutraDerm Therapeutic Skin Cream  NutraDerm Therapeutic Skin Lotion  ProShield Protective Hand Cream  Provon moisturizing lotion     Please read over the following fact sheets that you were given.

## 2023-01-28 ENCOUNTER — Encounter (HOSPITAL_COMMUNITY): Payer: Self-pay

## 2023-01-28 ENCOUNTER — Other Ambulatory Visit: Payer: Self-pay

## 2023-01-28 ENCOUNTER — Encounter (HOSPITAL_COMMUNITY)
Admission: RE | Admit: 2023-01-28 | Discharge: 2023-01-28 | Disposition: A | Discharge status: Home / Self Care | Payer: BC Managed Care – PPO | Source: Ambulatory Visit | Attending: Neurosurgery | Admitting: Neurosurgery

## 2023-01-28 VITALS — BP 110/76 | HR 71 | Temp 97.9°F | Resp 17 | Ht 67.0 in | Wt 270.0 lb

## 2023-01-28 DIAGNOSIS — Z01818 Encounter for other preprocedural examination: Secondary | ICD-10-CM

## 2023-01-28 HISTORY — DX: Other complications of anesthesia, initial encounter: T88.59XA

## 2023-01-28 HISTORY — DX: Essential (primary) hypertension: I10

## 2023-01-28 HISTORY — DX: Nausea with vomiting, unspecified: Z98.890

## 2023-01-28 HISTORY — DX: Pneumonia, unspecified organism: J18.9

## 2023-01-28 HISTORY — DX: Other specified postprocedural states: R11.2

## 2023-01-28 HISTORY — DX: Cardiac arrhythmia, unspecified: I49.9

## 2023-01-28 LAB — CBC
HCT: 41.3 % (ref 36.0–46.0)
Hemoglobin: 13.8 g/dL (ref 12.0–15.0)
MCH: 27.1 pg (ref 26.0–34.0)
MCHC: 33.4 g/dL (ref 30.0–36.0)
MCV: 81.1 fL (ref 80.0–100.0)
Platelets: 289 10*3/uL (ref 150–400)
RBC: 5.09 MIL/uL (ref 3.87–5.11)
RDW: 13.5 % (ref 11.5–15.5)
WBC: 9.6 10*3/uL (ref 4.0–10.5)
nRBC: 0 % (ref 0.0–0.2)

## 2023-01-28 LAB — BASIC METABOLIC PANEL
Anion gap: 9 (ref 5–15)
BUN: 13 mg/dL (ref 6–20)
CO2: 19 mmol/L — ABNORMAL LOW (ref 22–32)
Calcium: 9.3 mg/dL (ref 8.9–10.3)
Chloride: 107 mmol/L (ref 98–111)
Creatinine, Ser: 0.99 mg/dL (ref 0.44–1.00)
GFR, Estimated: >60 mL/min (ref >60)
Glucose, Bld: 118 mg/dL — ABNORMAL HIGH (ref 70–99)
Potassium: 3.3 mmol/L — ABNORMAL LOW (ref 3.5–5.1)
Sodium: 135 mmol/L (ref 135–145)

## 2023-01-28 LAB — SURGICAL PCR SCREEN
MRSA, PCR: NEGATIVE
Staphylococcus aureus: NEGATIVE

## 2023-01-28 NOTE — Anesthesia Preprocedure Evaluation (Signed)
Anesthesia Evaluation  Patient identified by MRN, date of birth, ID band Patient awake    Reviewed: Allergy & Precautions, H&P , NPO status , Patient's Chart, lab work & pertinent test results  History of Anesthesia Complications (+) PONV and history of anesthetic complications  Airway Mallampati: IV  TM Distance: >3 FB Neck ROM: Full    Dental no notable dental hx. (+) Teeth Intact, Dental Advisory Given   Pulmonary asthma , COPD,  COPD inhaler, former smoker   Pulmonary exam normal breath sounds clear to auscultation       Cardiovascular Exercise Tolerance: Good hypertension,  Rhythm:Regular Rate:Normal     Neuro/Psych  Headaches  Anxiety        GI/Hepatic Neg liver ROS,GERD  Medicated,,  Endo/Other    Morbid obesity  Renal/GU negative Renal ROS  negative genitourinary   Musculoskeletal  (+)  Fibromyalgia -  Abdominal   Peds  Hematology negative hematology ROS (+)   Anesthesia Other Findings   Reproductive/Obstetrics negative OB ROS                             Anesthesia Physical Anesthesia Plan  ASA: 3  Anesthesia Plan: General   Post-op Pain Management: Tylenol PO (pre-op)*, Ketamine IV* and Precedex   Induction: Intravenous  PONV Risk Score and Plan: 4 or greater and Ondansetron, Dexamethasone, Propofol infusion, TIVA and Scopolamine patch - Pre-op  Airway Management Planned: Oral ETT  Additional Equipment:   Intra-op Plan:   Post-operative Plan: Extubation in OR  Informed Consent: I have reviewed the patients History and Physical, chart, labs and discussed the procedure including the risks, benefits and alternatives for the proposed anesthesia with the patient or authorized representative who has indicated his/her understanding and acceptance.     Dental advisory given  Plan Discussed with: CRNA  Anesthesia Plan Comments: (See PAT note written 01/28/2023 by  Shonna Chock, PA-C.  Requests pre-oxygenation prior to induction due to history of O2 desaturation with anesthesia. Reported 2 negative sleep studies. )       Anesthesia Quick Evaluation

## 2023-01-28 NOTE — Progress Notes (Signed)
PCP - Hope Pigeon, DO Cardiologist - Chales Abrahams, Archie Pulmonologist - Lolita Rieger  PPM/ICD - denies  Device Orders - n/a Rep Notified - n/a Pt has cardiac loop recorder   Chest x-ray - denies EKG - 01/28/2023 Stress Test - 2021 (copy requested from [facility]) ECHO - 2021 (copy [facility]) Cardiac Cath - denies   Sleep Study - denies CPAP - n/a  Fasting Blood Sugar - no DM   Blood Thinner Instructions: n/a Aspirin Instructions: n/a  ERAS Protcol -yes PRE-SURGERY Ensure or G2- no  COVID TEST- n/a   Anesthesia review: yes; Revonda Standard, [place], is seeng the pt regarding pt's cardiac history.     Patient denies shortness of breath, fever, cough and chest pain at PAT appointment and the last 2 months.   All instructions explained to the patient, with a verbal understanding of the material. Patient agrees to go over the instructions while at home for a better understanding. Patient also instructed to self quarantine after being tested for COVID-19. The opportunity to ask questions was provided.

## 2023-01-28 NOTE — Progress Notes (Signed)
Anesthesia APP Evaluation:  Case: [id number] Date/Time: 02/01/23 1225   Procedure: ACDF - C5-C6 - 3C   Anesthesia type: General   Pre-op diagnosis: HNP   [facility]: [facility] ROOM [facility] / [facility]   Surgeons: Julio Sicks, MD       DISCUSSION: Patient is a 37 year old female scheduled for the above procedure.   History includes former smoker, post-operative N/V, COPD, asthma, HTN, syncope (11/22/19; "[employer]" Loop recorder 12/20/19), dysrhythmia (heart rhythm irregularities), Raynaud's disease, PCOS, fibromyalgia, Complex regional pain syndrome (following a fall and affecting RLE, RUE with contracted hand; affecting left foot ~ March 2024), ectopic pregnancy (s/p right salpingectomy ~ 2011), tonsillectomy (09/04/18), left MCL repair (02/27/20), laparoscopic left salpingectomy (04/09/22), anxiety.   She reported perioperative O2 saturations with regional and general anesthesia. Specifically, she remembers issues with right salpingectomy ~ 2011 and with tonsillectomy in 2019. She reported 2 negative sleep studies (2017, 2020, although reports not viewable in Care Everywhere). She does not use supplement home oxygen. She says her asthma is controlled and managed by pulmonology.  Since her tonsillectomy, she has been pre-oxygenated prior to induction for other procedures which has helped prevent any significant desaturations.   Most recent anesthesia records are from 07/09/22 [facility]) for EGD and colonscopy and on 03/31/22 for left salpingectomy ([facility]). Airway details include:  Airway not difficult. Preoxygenated: yes. Patient position: sniffing. Mask difficulty assessment: 0 - not attempted. Successful ETT intubation technique: direct laryngoscopy, intubating stylet and anterior pressure/BURP, Miller #2. Cuffed: yes. View (Cormack Lehane grade): grade I - visualization of entire laryngeal aperture. Reported needing hyper-oxygenation prior to surgery.   Last cardiology follow-up on 11/24/22 by Lisabeth Pick,  PA-C. She continued to have intermittent palpitations (feeling of fast then slow HR). Loop recorder reviewed which showed a brady episode during sleep hours and a brief PAC during wakeful hours, but no sustained arrhythmia. Prescribed labetalol. At PAT, she reported palpitations about twice a month without new changes. She denied any recurrent syncope since 2021. Denied chest pain and SOB.    She uses a compounded cream to left ankle per Dr. Rodolph Bong with [facility]. She says the cream includes lidocaine, baclofen, cyclobenzaprine, gabapentin, ketamine. She will hold this for 24 hours before surgery.    Anesthesia team to evaluate on the day of surgery.    VS: BP 110/76   Pulse 71   Temp 36.6 C   Resp 17   Ht 5\' 7"  (1.702 m)   Wt 122.5 kg   LMP 11/21/2022   SpO2 96%   BMI 42.29 kg/m  Patient is using a wheelchair. (She did say she can walk short distances.) Her left ankle is in a ace wrap due to CRPS. She denied any open wounds. Heart RRR, no murmur noted. Lungs clear. No right ankle edema.    PROVIDERS: Leola Brazil, DO is PCP  Dyane Dustman, MD is EP cardiologist [facility]). Initially saw Vilinda Boehringer, MD after syncope in 2021, but is now just followed by EP. Lolita Rieger, FNP is pulmonology provider ([facility])   LABS: Labs reviewed: Acceptable for surgery. (all labs ordered are listed, but only abnormal results are displayed)  Labs Reviewed  BASIC METABOLIC PANEL - Abnormal; Notable for the following components:      Result Value   Potassium 3.3 (*)    CO2 19 (*)    Glucose, Bld 118 (*)    All other components within normal limits  SURGICAL PCR SCREEN  CBC  IMAGES: CT Abd/pelvis 12/09/22 ([facility]): IMPRESSION: 1. No evidence of nephrolithiasis or hydronephrosis.  2. Normal appendix. No evidence of colitis or diverticulitis.  3. Status post cholecystectomy.   CT Chest 02/05/22 ([facility]): FINDINGS:  - Thoracic inlet/central airways: Thyroid normal.  Airway patent. History cardiac bronchus.  - Mediastinum/hila/axilla: No adenopathy.  - Heart/vessels: Normal heart size. No pericardial effusion. Aorta and pulmonary arteries are normal in caliber.  - Lungs/pleura: Scattered subsegmental atelectasis with a basilar predominance. Mild nonspecific bronchial wall thickening. No focal consolidation. No pleural effusion or pneumothorax. No pleural effusion or pneumothorax.  - Upper abdomen: Cholecystectomy with a dropped clip along the medial margin of the liver. Similar scattered punctate calcifications throughout the pancreas as can be seen in the setting of chronic pancreatitis. 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  - Chest wall/MSK: Healing right anterior fourth rib fracture. Leadless loop recorder over the left chest wall subcutaneous tissue. Osseous structures and soft tissue are otherwise unremarkable.  IMPRESSION: No acute cardiopulmonary findings.    EKG: 01/28/23: Normal sinus rhythm Low voltage QRS Possible Anterolateral infarct , age undetermined Abnormal ECG   CV: Loop recorder Interrogation 12/14/22 ([facility]) IMPRESSION: No significant arrhythmia recorded. Summary Report. Battery: good ; Episodes: 3 symptom episodes - patient  reported symptoms of dizziness and lightheaded. EGMs show SR/ST ;  Histograms: good    30 day Cardiac event monitor 12/26/19 ([facility]) Cardiac event recorder reveals normal sinus rhythm with average heart rate 78.  There was no cardiac ectopy or cardiac arrhythmias.  Multiple symptoms were reported but none correlated with any cardiac arrhythmias.  Conclusion: Essentially normal study    Echo 12/13/19 ([facility]): Adequate 2D M-mode and color flow Doppler echocardiogram demonstrates  normal left ventricular chamber size and contractility  2.  Systolic performance is normal, ejection fraction is 55 to 60%  3.  The aortic, mitral, tricuspid, and pulmonic valves are grossly  normal  4.  Trace mitral regurgitation is noted  5.  The atria are of normal size bilaterally  6.  Right ventricular structure and function is normal  7.  The pericardium is normal thickness there is no effusion    Nuclear stress test 12/05/19 ([facility]): IMPRESSION:  Normal cardiac perfusion exam. Prognostically this is a low risk scan    Past Medical History:  Diagnosis Date   2022    Anxiety    Asthma    COPD (chronic obstructive pulmonary disease) (HCC)    CRPS (complex regional pain syndrome type I)    Dysrhythmia    heart rate irregularities; Loop recorder 12/20/19   Ectopic pregnancy    Fibromyalgia    H/O multiple allergies    Hypertension    Migraines    O2 drop; needs hyperoxygenation before procedure    PCOS (polycystic ovarian syndrome)    PONV (postoperative nausea and vomiting)    Raynaud disease     Past Surgical History:  Procedure Laterality Date   BIOPSY  07/09/2022   Procedure: BIOPSY;  Surgeon: Charlott Rakes, MD;  Location: [facility];  Service: Gastroenterology;;   CHOLECYSTECTOMY     COLONOSCOPY WITH PROPOFOL N/A 07/09/2022   Procedure: COLONOSCOPY WITH PROPOFOL;  Surgeon: Charlott Rakes, MD;  Location: [facility];  Service: Gastroenterology;  Laterality: N/A;   ECTOPIC PREGNANCY SURGERY     ESOPHAGOGASTRODUODENOSCOPY (EGD) WITH PROPOFOL N/A 07/09/2022   Procedure: ESOPHAGOGASTRODUODENOSCOPY (EGD) WITH PROPOFOL;  Surgeon: Charlott Rakes, MD;  Location: [facility];  Service: Gastroenterology;  Laterality:  N/A;  With possible Dilation   MEDIAL COLLATERAL LIGAMENT REPAIR, KNEE Left    TONSILLECTOMY      MEDICATIONS:  albuterol (VENTOLIN HFA) 108 (90 Base) MCG/ACT inhaler   amitriptyline (ELAVIL) 10 MG tablet   desvenlafaxine (PRISTIQ) 100 MG 24 hr tablet   dicyclomine (BENTYL) 20 MG tablet   EPINEPHrine 0.3 mg/0.3 mL IJ SOAJ injection   etonogestrel (NEXPLANON) 68 MG IMPL implant   gabapentin (NEURONTIN) 300 MG capsule    HYDROcodone-acetaminophen (NORCO) 10-325 MG tablet   hydrOXYzine (VISTARIL) 50 MG capsule   ibuprofen (ADVIL) 800 MG tablet   ipratropium-albuterol (DUONEB) 0.5-2.5 (3) MG/3ML SOLN   labetalol (NORMODYNE) 200 MG tablet   LORazepam (ATIVAN) 0.5 MG tablet   montelukast (SINGULAIR) 10 MG tablet   norethindrone-ethinyl estradiol (LOESTRIN) 1-20 MG-MCG tablet   omeprazole (PRILOSEC) 40 MG capsule   ondansetron (ZOFRAN) 4 MG/5ML solution   sulfamethoxazole-trimethoprim (BACTRIM DS) 800-160 MG tablet   tiZANidine (ZANAFLEX) 4 MG tablet   topiramate (TOPAMAX) 50 MG tablet   No current facility-administered medications for this encounter.    Shonna Chock, PA-C Surgical Short Stay/Anesthesiology [facility] Phone [phone] [facility] Phone [phone] 01/28/2023 1:38 PM

## 2023-01-31 MED ORDER — VANCOMYCIN HCL 1500 MG/300ML IV SOLN
1500.0000 mg | INTRAVENOUS | Status: AC
  Administered 2023-02-01: 1500 mg via INTRAVENOUS
  Filled 2023-01-31 (×2): qty 300

## 2023-02-01 ENCOUNTER — Encounter (HOSPITAL_COMMUNITY): Payer: Self-pay | Admitting: Neurosurgery

## 2023-02-01 ENCOUNTER — Ambulatory Visit (HOSPITAL_COMMUNITY): Payer: BC Managed Care – PPO | Admitting: Vascular Surgery

## 2023-02-01 ENCOUNTER — Other Ambulatory Visit: Payer: Self-pay

## 2023-02-01 ENCOUNTER — Observation Stay (HOSPITAL_COMMUNITY)
Admission: RE | Admit: 2023-02-01 | Discharge: 2023-02-01 | Disposition: A | Discharge status: Home / Self Care | Payer: BC Managed Care – PPO | Attending: Neurosurgery | Admitting: Neurosurgery

## 2023-02-01 ENCOUNTER — Ambulatory Visit (HOSPITAL_COMMUNITY): Payer: BC Managed Care – PPO

## 2023-02-01 ENCOUNTER — Encounter (HOSPITAL_COMMUNITY)
Admission: RE | Disposition: A | Discharge status: Home / Self Care | Payer: Self-pay | Source: Home / Self Care | Attending: Neurosurgery

## 2023-02-01 DIAGNOSIS — Z87891 Personal history of nicotine dependence: Secondary | ICD-10-CM | POA: Insufficient documentation

## 2023-02-01 DIAGNOSIS — Z79899 Other long term (current) drug therapy: Secondary | ICD-10-CM | POA: Diagnosis not present

## 2023-02-01 DIAGNOSIS — M4802 Spinal stenosis, cervical region: Secondary | ICD-10-CM | POA: Insufficient documentation

## 2023-02-01 DIAGNOSIS — J449 Chronic obstructive pulmonary disease, unspecified: Secondary | ICD-10-CM | POA: Insufficient documentation

## 2023-02-01 DIAGNOSIS — I10 Essential (primary) hypertension: Secondary | ICD-10-CM | POA: Diagnosis not present

## 2023-02-01 DIAGNOSIS — M50822 Other cervical disc disorders at C5-C6 level: Secondary | ICD-10-CM | POA: Diagnosis present

## 2023-02-01 DIAGNOSIS — J45909 Unspecified asthma, uncomplicated: Secondary | ICD-10-CM | POA: Diagnosis not present

## 2023-02-01 HISTORY — PX: ANTERIOR CERVICAL DECOMP/DISCECTOMY FUSION: SHX1161

## 2023-02-01 LAB — POCT PREGNANCY, URINE: Preg Test, Ur: NEGATIVE

## 2023-02-01 SURGERY — ANTERIOR CERVICAL DECOMPRESSION/DISCECTOMY FUSION 1 LEVEL
Anesthesia: General

## 2023-02-01 MED ORDER — MENTHOL 3 MG MT LOZG: 1.0000 | LOZENGE | OROMUCOSAL | Status: DC | PRN

## 2023-02-01 MED ORDER — IBUPROFEN 800 MG PO TABS: 800.0000 mg | ORAL_TABLET | Freq: Three times a day (TID) | ORAL | Status: DC | PRN

## 2023-02-01 MED ORDER — NORETHINDRONE ACET-ETHINYL EST 1-20 MG-MCG PO TABS: 1.0000 | ORAL_TABLET | Freq: Every day | ORAL | Status: DC

## 2023-02-01 MED ORDER — PHENOL 1.4 % MT LIQD: 1.0000 | OROMUCOSAL | Status: DC | PRN

## 2023-02-01 MED ORDER — HYDROCODONE-ACETAMINOPHEN 10-325 MG PO TABS: 2.0000 | ORAL_TABLET | ORAL | Status: DC | PRN

## 2023-02-01 MED ORDER — SCOPOLAMINE 1 MG/3DAYS TD PT72
MEDICATED_PATCH | TRANSDERMAL | Status: AC
  Administered 2023-02-01: 1.5 mg via TRANSDERMAL
  Filled 2023-02-01: qty 1

## 2023-02-01 MED ORDER — FENTANYL CITRATE (PF) 250 MCG/5ML IJ SOLN
INTRAMUSCULAR | Status: AC
  Filled 2023-02-01: qty 5

## 2023-02-01 MED ORDER — SCOPOLAMINE 1 MG/3DAYS TD PT72: 1.0000 | MEDICATED_PATCH | TRANSDERMAL | Status: DC

## 2023-02-01 MED ORDER — LABETALOL HCL 200 MG PO TABS
200.0000 mg | ORAL_TABLET | Freq: Two times a day (BID) | ORAL | Status: DC
  Filled 2023-02-01: qty 1

## 2023-02-01 MED ORDER — LIDOCAINE 2% (20 MG/ML) 5 ML SYRINGE
INTRAMUSCULAR | Status: AC
  Filled 2023-02-01: qty 5

## 2023-02-01 MED ORDER — VANCOMYCIN HCL 1500 MG/300ML IV SOLN
1500.0000 mg | Freq: Once | INTRAVENOUS | Status: DC
  Filled 2023-02-01: qty 300

## 2023-02-01 MED ORDER — CHLORHEXIDINE GLUCONATE 0.12 % MT SOLN
15.0000 mL | Freq: Once | OROMUCOSAL | Status: AC
  Administered 2023-02-01: 15 mL via OROMUCOSAL
  Filled 2023-02-01: qty 15

## 2023-02-01 MED ORDER — CHLORHEXIDINE GLUCONATE CLOTH 2 % EX PADS: 6.0000 | MEDICATED_PAD | Freq: Once | CUTANEOUS | Status: DC

## 2023-02-01 MED ORDER — PROPOFOL 10 MG/ML IV BOLUS
INTRAVENOUS | Status: AC
  Filled 2023-02-01: qty 20

## 2023-02-01 MED ORDER — MIDAZOLAM HCL 2 MG/2ML IJ SOLN
INTRAMUSCULAR | Status: AC
  Filled 2023-02-01: qty 2

## 2023-02-01 MED ORDER — DEXAMETHASONE SODIUM PHOSPHATE 10 MG/ML IJ SOLN
INTRAMUSCULAR | Status: DC | PRN
  Administered 2023-02-01: 10 mg via INTRAVENOUS

## 2023-02-01 MED ORDER — ACETAMINOPHEN 650 MG RE SUPP: 650.0000 mg | RECTAL | Status: DC | PRN

## 2023-02-01 MED ORDER — KETAMINE HCL 10 MG/ML IJ SOLN
INTRAMUSCULAR | Status: DC | PRN
  Administered 2023-02-01: 50 mg via INTRAVENOUS

## 2023-02-01 MED ORDER — SODIUM CHLORIDE 0.9% FLUSH
3.0000 mL | Freq: Two times a day (BID) | INTRAVENOUS | Status: DC
  Administered 2023-02-01: 3 mL via INTRAVENOUS

## 2023-02-01 MED ORDER — VENLAFAXINE HCL ER 75 MG PO CP24: 150.0000 mg | ORAL_CAPSULE | Freq: Every day | ORAL | Status: DC

## 2023-02-01 MED ORDER — MONTELUKAST SODIUM 10 MG PO TABS: 10.0000 mg | ORAL_TABLET | Freq: Every day | ORAL | Status: DC

## 2023-02-01 MED ORDER — HYDROCODONE-ACETAMINOPHEN 10-325 MG PO TABS: 1.0000 | ORAL_TABLET | Freq: Four times a day (QID) | ORAL | 0 refills | Status: AC

## 2023-02-01 MED ORDER — LORAZEPAM 0.5 MG PO TABS: 0.5000 mg | ORAL_TABLET | Freq: Every day | ORAL | Status: DC | PRN

## 2023-02-01 MED ORDER — LACTATED RINGERS IV SOLN: INTRAVENOUS | Status: DC

## 2023-02-01 MED ORDER — FENTANYL CITRATE (PF) 250 MCG/5ML IJ SOLN
INTRAMUSCULAR | Status: DC | PRN
  Administered 2023-02-01 (×2): 50 ug via INTRAVENOUS
  Administered 2023-02-01: 150 ug via INTRAVENOUS

## 2023-02-01 MED ORDER — ROCURONIUM BROMIDE 10 MG/ML (PF) SYRINGE
PREFILLED_SYRINGE | INTRAVENOUS | Status: AC
  Filled 2023-02-01: qty 10

## 2023-02-01 MED ORDER — ALBUTEROL SULFATE (2.5 MG/3ML) 0.083% IN NEBU: 3.0000 mL | INHALATION_SOLUTION | Freq: Four times a day (QID) | RESPIRATORY_TRACT | Status: DC | PRN

## 2023-02-01 MED ORDER — ACETAMINOPHEN 500 MG PO TABS
1000.0000 mg | ORAL_TABLET | Freq: Once | ORAL | Status: AC
  Administered 2023-02-01: 1000 mg via ORAL
  Filled 2023-02-01: qty 2

## 2023-02-01 MED ORDER — SODIUM CHLORIDE 0.9% FLUSH: 3.0000 mL | INTRAVENOUS | Status: DC | PRN

## 2023-02-01 MED ORDER — ONDANSETRON HCL 4 MG/2ML IJ SOLN: 4.0000 mg | Freq: Four times a day (QID) | INTRAMUSCULAR | Status: DC | PRN

## 2023-02-01 MED ORDER — DEXAMETHASONE SODIUM PHOSPHATE 10 MG/ML IJ SOLN
INTRAMUSCULAR | Status: AC
  Filled 2023-02-01: qty 1

## 2023-02-01 MED ORDER — TIZANIDINE HCL 4 MG PO TABS
4.0000 mg | ORAL_TABLET | Freq: Four times a day (QID) | ORAL | Status: DC | PRN
  Administered 2023-02-01: 4 mg via ORAL
  Filled 2023-02-01: qty 1

## 2023-02-01 MED ORDER — DEXMEDETOMIDINE HCL IN NACL 80 MCG/20ML IV SOLN
INTRAVENOUS | Status: DC | PRN
  Administered 2023-02-01 (×4): 4 ug via INTRAVENOUS

## 2023-02-01 MED ORDER — GABAPENTIN 300 MG PO CAPS
900.0000 mg | ORAL_CAPSULE | Freq: Three times a day (TID) | ORAL | Status: DC
  Administered 2023-02-01: 900 mg via ORAL
  Filled 2023-02-01: qty 3

## 2023-02-01 MED ORDER — SUGAMMADEX SODIUM 200 MG/2ML IV SOLN
INTRAVENOUS | Status: DC | PRN
  Administered 2023-02-01 (×2): 200 mg via INTRAVENOUS

## 2023-02-01 MED ORDER — HYDROMORPHONE HCL 1 MG/ML IJ SOLN: 1.0000 mg | INTRAMUSCULAR | Status: DC | PRN

## 2023-02-01 MED ORDER — DICYCLOMINE HCL 20 MG PO TABS: 20.0000 mg | ORAL_TABLET | Freq: Two times a day (BID) | ORAL | Status: DC | PRN

## 2023-02-01 MED ORDER — PROPOFOL 10 MG/ML IV BOLUS
INTRAVENOUS | Status: DC | PRN
  Administered 2023-02-01: 200 mg via INTRAVENOUS

## 2023-02-01 MED ORDER — THROMBIN 5000 UNITS EX SOLR
CUTANEOUS | Status: AC
  Filled 2023-02-01: qty 15000

## 2023-02-01 MED ORDER — MIDAZOLAM HCL 2 MG/2ML IJ SOLN
INTRAMUSCULAR | Status: DC | PRN
  Administered 2023-02-01: 2 mg via INTRAVENOUS

## 2023-02-01 MED ORDER — TOPIRAMATE 100 MG PO TABS: 100.0000 mg | ORAL_TABLET | Freq: Every day | ORAL | Status: DC

## 2023-02-01 MED ORDER — AMITRIPTYLINE HCL 10 MG PO TABS
10.0000 mg | ORAL_TABLET | Freq: Every day | ORAL | Status: DC
  Filled 2023-02-01: qty 1

## 2023-02-01 MED ORDER — MORPHINE SULFATE (PF) 2 MG/ML IV SOLN
INTRAVENOUS | Status: AC
  Filled 2023-02-01: qty 1

## 2023-02-01 MED ORDER — LIDOCAINE 2% (20 MG/ML) 5 ML SYRINGE
INTRAMUSCULAR | Status: DC | PRN
  Administered 2023-02-01: 60 mg via INTRAVENOUS

## 2023-02-01 MED ORDER — SULFAMETHOXAZOLE-TRIMETHOPRIM 800-160 MG PO TABS
1.0000 | ORAL_TABLET | Freq: Two times a day (BID) | ORAL | Status: DC
  Filled 2023-02-01: qty 1

## 2023-02-01 MED ORDER — IPRATROPIUM-ALBUTEROL 0.5-2.5 (3) MG/3ML IN SOLN: 3.0000 mL | Freq: Every day | RESPIRATORY_TRACT | Status: DC | PRN

## 2023-02-01 MED ORDER — ROCURONIUM BROMIDE 10 MG/ML (PF) SYRINGE
PREFILLED_SYRINGE | INTRAVENOUS | Status: DC | PRN
  Administered 2023-02-01: 60 mg via INTRAVENOUS
  Administered 2023-02-01: 30 mg via INTRAVENOUS

## 2023-02-01 MED ORDER — PROPOFOL 1000 MG/100ML IV EMUL
INTRAVENOUS | Status: AC
  Filled 2023-02-01: qty 100

## 2023-02-01 MED ORDER — MORPHINE SULFATE (PF) 2 MG/ML IV SOLN
1.0000 mg | INTRAVENOUS | Status: DC | PRN
  Administered 2023-02-01: 1 mg via INTRAVENOUS

## 2023-02-01 MED ORDER — HYDROXYZINE HCL 10 MG PO TABS: 50.0000 mg | ORAL_TABLET | Freq: Two times a day (BID) | ORAL | Status: DC

## 2023-02-01 MED ORDER — 0.9 % SODIUM CHLORIDE (POUR BTL) OPTIME
TOPICAL | Status: DC | PRN
  Administered 2023-02-01: 1000 mL

## 2023-02-01 MED ORDER — ORAL CARE MOUTH RINSE: 15.0000 mL | Freq: Once | OROMUCOSAL | Status: AC

## 2023-02-01 MED ORDER — PANTOPRAZOLE SODIUM 40 MG PO TBEC: 40.0000 mg | DELAYED_RELEASE_TABLET | Freq: Every day | ORAL | Status: DC

## 2023-02-01 MED ORDER — ONDANSETRON HCL 4 MG PO TABS: 4.0000 mg | ORAL_TABLET | Freq: Four times a day (QID) | ORAL | Status: DC | PRN

## 2023-02-01 MED ORDER — HYDROCODONE-ACETAMINOPHEN 10-325 MG PO TABS
1.0000 | ORAL_TABLET | Freq: Four times a day (QID) | ORAL | Status: DC
  Administered 2023-02-01: 1 via ORAL
  Filled 2023-02-01: qty 1

## 2023-02-01 MED ORDER — ONDANSETRON HCL 4 MG/5ML PO SOLN: 8.0000 mg | Freq: Two times a day (BID) | ORAL | Status: DC | PRN

## 2023-02-01 MED ORDER — ONDANSETRON HCL 4 MG/2ML IJ SOLN
INTRAMUSCULAR | Status: DC | PRN
  Administered 2023-02-01: 4 mg via INTRAVENOUS

## 2023-02-01 MED ORDER — ONDANSETRON HCL 4 MG/2ML IJ SOLN
INTRAMUSCULAR | Status: AC
  Filled 2023-02-01: qty 2

## 2023-02-01 MED ORDER — ACETAMINOPHEN 325 MG PO TABS: 650.0000 mg | ORAL_TABLET | ORAL | Status: DC | PRN

## 2023-02-01 MED ORDER — KETAMINE HCL 50 MG/5ML IJ SOSY
PREFILLED_SYRINGE | INTRAMUSCULAR | Status: AC
  Filled 2023-02-01: qty 5

## 2023-02-01 MED ORDER — SODIUM CHLORIDE 0.9 % IV SOLN
250.0000 mL | INTRAVENOUS | Status: DC
  Administered 2023-02-01: 250 mL via INTRAVENOUS

## 2023-02-01 MED ORDER — TIZANIDINE HCL 4 MG PO TABS: 4.0000 mg | ORAL_TABLET | Freq: Three times a day (TID) | ORAL | 0 refills | Status: AC | PRN

## 2023-02-01 MED ORDER — PROPOFOL 500 MG/50ML IV EMUL
INTRAVENOUS | Status: DC | PRN
  Administered 2023-02-01: 100 ug/kg/min via INTRAVENOUS

## 2023-02-01 MED ORDER — THROMBIN (RECOMBINANT) 5000 UNITS EX SOLR
CUTANEOUS | Status: DC | PRN
  Administered 2023-02-01: 10 mL via TOPICAL

## 2023-02-01 SURGICAL SUPPLY — 52 items
APL SKNCLS STERI-STRIP NONHPOA (GAUZE/BANDAGES/DRESSINGS) ×1
BAG COUNTER SPONGE SURGICOUNT (BAG) ×1 IMPLANT
BAG DECANTER FOR FLEXI CONT (MISCELLANEOUS) ×1 IMPLANT
BAG SPNG CNTER NS LX DISP (BAG) ×1
BENZOIN TINCTURE PRP APPL 2/3 (GAUZE/BANDAGES/DRESSINGS) ×1 IMPLANT
BIT DRILL 13 (BIT) IMPLANT
BUR MATCHSTICK NEURO 3.0 LAGG (BURR) ×1 IMPLANT
CAGE PEEK 6X14X11 (Cage) ×1 IMPLANT
CANISTER SUCT 3000ML PPV (MISCELLANEOUS) ×1 IMPLANT
DRAPE C-ARM 42X72 X-RAY (DRAPES) ×2 IMPLANT
DRAPE LAPAROTOMY 100X72 PEDS (DRAPES) ×1 IMPLANT
DRAPE MICROSCOPE SLANT 54X150 (MISCELLANEOUS) ×1 IMPLANT
DURAPREP 6ML APPLICATOR 50/CS (WOUND CARE) ×1 IMPLANT
ELECT COATED BLADE 2.86 ST (ELECTRODE) ×1 IMPLANT
ELECT REM PT RETURN 9FT ADLT (ELECTROSURGICAL) ×1
ELECTRODE REM PT RTRN 9FT ADLT (ELECTROSURGICAL) ×1 IMPLANT
GAUZE 4X4 16PLY ~~LOC~~+RFID DBL (SPONGE) IMPLANT
GAUZE SPONGE 4X4 12PLY STRL (GAUZE/BANDAGES/DRESSINGS) ×1 IMPLANT
GAUZE SPONGE 4X4 12PLY STRL LF (GAUZE/BANDAGES/DRESSINGS) IMPLANT
GLOVE ECLIPSE 9.0 STRL (GLOVE) ×1 IMPLANT
GLOVE EXAM NITRILE XL STR (GLOVE) IMPLANT
GOWN STRL REUS W/ TWL LRG LVL3 (GOWN DISPOSABLE) IMPLANT
GOWN STRL REUS W/ TWL XL LVL3 (GOWN DISPOSABLE) IMPLANT
GOWN STRL REUS W/TWL 2XL LVL3 (GOWN DISPOSABLE) IMPLANT
GOWN STRL REUS W/TWL LRG LVL3 (GOWN DISPOSABLE)
GOWN STRL REUS W/TWL XL LVL3 (GOWN DISPOSABLE)
HALTER HD/CHIN CERV TRACTION D (MISCELLANEOUS) ×1 IMPLANT
HEMOSTAT POWDER KIT SURGIFOAM (HEMOSTASIS) IMPLANT
KIT BASIN OR (CUSTOM PROCEDURE TRAY) ×1 IMPLANT
KIT TURNOVER KIT B (KITS) ×1 IMPLANT
NDL SPNL 20GX3.5 QUINCKE YW (NEEDLE) ×1 IMPLANT
NEEDLE SPNL 20GX3.5 QUINCKE YW (NEEDLE) ×1 IMPLANT
NS IRRIG 1000ML POUR BTL (IV SOLUTION) ×1 IMPLANT
PACK LAMINECTOMY NEURO (CUSTOM PROCEDURE TRAY) ×1 IMPLANT
PAD ARMBOARD 7.5X6 YLW CONV (MISCELLANEOUS) ×3 IMPLANT
PLATE 23MM (Plate) IMPLANT
SCREW ST 13X4XST VA NS SPNE (Screw) IMPLANT
SCREW ST VAR 4 ATL (Screw) ×4 IMPLANT
SOL ELECTROSURG ANTI STICK (MISCELLANEOUS) ×1
SOLUTION ELECTROSURG ANTI STCK (MISCELLANEOUS) ×1 IMPLANT
SPACER SPNL 11X14X6XPEEK CVD (Cage) IMPLANT
SPCR SPNL 11X14X6XPEEK CVD (Cage) ×1 IMPLANT
SPONGE INTESTINAL PEANUT (DISPOSABLE) ×1 IMPLANT
SPONGE SURGIFOAM ABS GEL SZ50 (HEMOSTASIS) ×1 IMPLANT
STRIP CLOSURE SKIN 1/2X4 (GAUZE/BANDAGES/DRESSINGS) ×1 IMPLANT
SUT VIC AB 3-0 SH 8-18 (SUTURE) ×1 IMPLANT
SUT VIC AB 4-0 RB1 18 (SUTURE) ×1 IMPLANT
TAPE CLOTH 4X10 WHT NS (GAUZE/BANDAGES/DRESSINGS) ×1 IMPLANT
TOWEL GREEN STERILE (TOWEL DISPOSABLE) ×1 IMPLANT
TOWEL GREEN STERILE FF (TOWEL DISPOSABLE) ×1 IMPLANT
TRAP SPECIMEN MUCUS 40CC (MISCELLANEOUS) ×1 IMPLANT
WATER STERILE IRR 1000ML POUR (IV SOLUTION) ×1 IMPLANT

## 2023-02-01 NOTE — Anesthesia Postprocedure Evaluation (Signed)
Anesthesia Post Note  Patient: Joanna Zimmerman  Procedure(s) Performed: Anterior Cervical Decmpression Fusion  - Cervical five-Cervical six     Patient location during evaluation: PACU Anesthesia Type: General Level of consciousness: awake and alert Pain management: pain level controlled Vital Signs Assessment: post-procedure vital signs reviewed and stable Respiratory status: spontaneous breathing, nonlabored ventilation and respiratory function stable Cardiovascular status: blood pressure returned to baseline and stable Postop Assessment: no apparent nausea or vomiting Anesthetic complications: no  There were no known notable events for this encounter.  Last Vitals:  Vitals:   02/01/23 1330 02/01/23 1350  BP: (!) 143/91 135/87  Pulse: 82 85  Resp: 16 17  Temp: 36.9 C   SpO2: 92% 92%    Last Pain:  Vitals:   02/01/23 1330  PainSc: 0-No pain                 Khira Cudmore,W. EDMOND

## 2023-02-01 NOTE — Evaluation (Signed)
Occupational Therapy Evaluation Patient Details Name: Joanna Zimmerman MRN: [id number] DOB: 1986-06-05 Today's Date: 02/01/2023   History of Present Illness Joanna Zimmerman is a 37 yo female who underwent Anterior Cervical Decmpression Fusion  - Cervical five-Cervical six 5/21. PMHx: anxiety, COPD, CRPS, fibromyalgia, HTN, PCOS, raynauds   Clinical Impression   Joanna Zimmerman was evaluated s/p the above spine surgery. She is mobilized at [facility] level at baseline but is able to walk short distances with 1 forearm crutch; she also reports her friend/family assists with LB ADLs. Upon evaluation she was limited by cervical pain, knowledge of precautions and activity tolerance. Overall she was generalized supervision A for all functioanl mobility without AD and needed min A for LB ADLs. . Provided cues and education on spinal precautions and compensatory techniques throughout, handout provided and pt demonstrated great recall. Pt does not require further acute OT services. Recommend d/c home with support of family.        Recommendations for follow up therapy are one component of a multi-disciplinary discharge planning process, led by the attending physician.  Recommendations may be updated based on patient status, additional functional criteria and insurance authorization.   Assistance Recommended at Discharge Set up Supervision/Assistance  Patient can return home with the following A little help with walking and/or transfers;A little help with bathing/dressing/bathroom;Assistance with cooking/housework;Assist for transportation;Help with stairs or ramp for entrance    Functional Status Assessment  Patient has had a recent decline in their functional status and demonstrates the ability to make significant improvements in function in a reasonable and predictable amount of time.  Equipment Recommendations  None recommended by OT    Recommendations for Other Services       Precautions /  Restrictions Precautions Precautions: Fall;Cervical Precaution Booklet Issued: Yes (comment) Required Braces or Orthoses: Cervical Brace Cervical Brace: Soft collar Restrictions Weight Bearing Restrictions: No      Mobility Bed Mobility Overal bed mobility: Needs Assistance Bed Mobility: Rolling, Sidelying to Sit, Supine to Sit Rolling: Supervision Sidelying to sit: Supervision       General bed mobility comments: no assist needed    Transfers Overall transfer level: Needs assistance Equipment used: None Transfers: Sit to/from Stand Sit to Stand: Supervision                  Balance Overall balance assessment: Needs assistance Sitting-balance support: Feet supported Sitting balance-Leahy Scale: Good     Standing balance support: No upper extremity supported, During functional activity Standing balance-Leahy Scale: Fair                             ADL either performed or assessed with clinical judgement   ADL Overall ADL's : Needs assistance/impaired                                       General ADL Comments: Pt at her functional baseline with min A needed for LB ADLs and set up for UB ADLs. Educated pt on compensatory tehcniques for grooming at the sink. No AD used this session, no physical assist needed for transfers or mobility.     Vision Baseline Vision/History: 0 No visual deficits Vision Assessment?: No apparent visual deficits     Perception Perception Perception Tested?: No   Praxis Praxis Praxis tested?: Not tested    Pertinent Vitals/Pain Pain Assessment Pain Assessment:  0-10 Pain Score: 8  Pain Location: neck Pain Descriptors / Indicators: Discomfort Pain Intervention(s): Limited activity within patient's tolerance, Monitored during session     Hand Dominance Right   Extremity/Trunk Assessment Upper Extremity Assessment Upper Extremity Assessment: Generalized weakness (chronic R wrist pain)   Lower  Extremity Assessment Lower Extremity Assessment:  (CRPS knee and ankle)   Cervical / Trunk Assessment Cervical / Trunk Assessment: Neck Surgery   Communication Communication Communication: No difficulties   Cognition Arousal/Alertness: Awake/alert Behavior During Therapy: WFL for tasks assessed/performed Overall Cognitive Status: Within Functional Limits for tasks assessed                                       General Comments  VSS, husband present and supportive    Exercises     Shoulder Instructions      Home Living Family/patient expects to be discharged to:: Private residence Living Arrangements: Spouse/significant other;Children Available Help at Discharge: Family;Friend(s);Available 24 hours/day Type of Home: House Home Access: Stairs to enter [employer] of Steps: 1   Home Layout: One level     Bathroom Shower/Tub: Chief Strategy Officer: Handicapped height     Home Equipment: Crutches;Shower seat;Wheelchair - manual   Additional Comments: pt sleeps in recliner      Prior Functioning/Environment Prior Level of Function : Needs assist             Mobility Comments: mobilized in WC, walks short distances with 1 forearm crutch ADLs Comments: friend or family asissts with LB ADLs        OT Problem List: Decreased strength;Decreased range of motion;Decreased activity tolerance;Impaired balance (sitting and/or standing);Decreased safety awareness;Decreased knowledge of precautions;Decreased knowledge of use of DME or AE;Pain      OT Treatment/Interventions:      OT Goals(Current goals can be found in the care plan section) Acute Rehab OT Goals Patient Stated Goal: home today OT Goal Formulation: With patient Time For Goal Achievement: 02/15/23 Potential to Achieve Goals: Good  OT Frequency:      Co-evaluation              AM-PAC OT "6 Clicks" Daily Activity     Outcome Measure Help from another person  eating meals?: None Help from another person taking care of personal grooming?: A Little Help from another person toileting, which includes using toliet, bedpan, or urinal?: A Little Help from another person bathing (including washing, rinsing, drying)?: A Little Help from another person to put on and taking off regular upper body clothing?: None Help from another person to put on and taking off regular lower body clothing?: A Little 6 Click Score: 20   End of Session Equipment Utilized During Treatment: Cervical collar Nurse Communication: Mobility status  Activity Tolerance: Patient tolerated treatment well Patient left: in bed;with call bell/phone within reach;with family/visitor present  OT Visit Diagnosis: Other abnormalities of gait and mobility (R26.89);Muscle weakness (generalized) (M62.81);Pain                Time: 2956-2130 OT Time Calculation (min): 20 min Charges:  OT General Charges $OT Visit: 1 Visit OT Evaluation $OT Eval Moderate Complexity: 1 Mod  Derenda Mis, OTR/L Acute Rehabilitation Services Office [phone] Secure Chat Communication Preferred   Donia Pounds 02/01/2023, 4:03 PM

## 2023-02-01 NOTE — Discharge Summary (Signed)
Physician Discharge Summary  Patient ID: Joanna Zimmerman MRN: [id number] DOB/AGE: Feb 09, 1986 37 y.o.  Admit date: 02/01/2023 Discharge date: 02/01/2023  Admission Diagnoses:  Discharge Diagnoses:  Principal Problem:   Cervical stenosis of spinal canal   Discharged Condition: good  Hospital Course: Patient admitted to the hospital where she underwent uncomplicated C5-6 anterior cervical discectomy and fusion.  Postoperatively doing well.  Preoperative severe neck pain and upper extremity symptoms much improved.  Standing ambulating and voiding without difficulty.  Ready for discharge home.  Consults:   Significant Diagnostic Studies:   Treatments:   Discharge Exam: Blood pressure (!) 134/94, pulse 88, temperature 98.5 F (36.9 C), resp. rate 18, height 5\' 7"  (1.702 m), weight 122.5 kg, last menstrual period 11/21/2022, SpO2 95 %, currently breastfeeding. Awake and alert.  Oriented and appropriate.  Motor and sensory function stable.  Wound clean and dry.  Chest and abdomen benign.  Disposition: Discharge disposition: 01-Home or Self Care        Allergies as of 02/01/2023       Reactions   Bromelains    Other reaction(s): Diarrhea   Capsicum Hives   Bell peppers   Cefdinir Anaphylaxis   Penicillins Anaphylaxis   All cillins or related to cillins   Tape Rash   Also allergic to paper tape Can use tegaderm   Wound Dressing Adhesive Rash   Also allergic to paper tape Surgical Glue   Oxybutynin Other (See Comments)   Dry mouth / "like swallowing nails"   Baclofen Nausea And Vomiting   Dilaudid [hydromorphone] Nausea And Vomiting   Severe vomiting   Doxycycline Diarrhea   Keppra [levetiracetam] Swelling   Levofloxacin Other (See Comments)   Muscles froze   Nifedipine Other (See Comments)   Hand tingling   Pineapple Nausea And Vomiting   Relafen [nabumetone] Hives, Nausea And Vomiting   Robaxin [methocarbamol] Nausea And Vomiting   Buspirone Rash    Venlafaxine Rash        Medication List     TAKE these medications    albuterol 108 (90 Base) MCG/ACT inhaler Commonly known as: VENTOLIN HFA Inhale 2 puffs into the lungs every 6 (six) hours as needed for wheezing or shortness of breath.   amitriptyline 10 MG tablet Commonly known as: ELAVIL Take 10 mg by mouth at bedtime.   desvenlafaxine 100 MG 24 hr tablet Commonly known as: PRISTIQ Take 100 mg by mouth daily.   dicyclomine 20 MG tablet Commonly known as: BENTYL Take 1 tablet (20 mg total) by mouth 2 (two) times daily as needed for spasms (abdominal cramping).   EPINEPHrine 0.3 mg/0.3 mL Soaj injection Commonly known as: EPI-PEN Inject 0.3 mg into the muscle as needed for anaphylaxis.   gabapentin 300 MG capsule Commonly known as: NEURONTIN Take 900 mg by mouth 3 (three) times daily.   HYDROcodone-acetaminophen 10-325 MG tablet Commonly known as: NORCO Take 1 tablet by mouth every 6 (six) hours.   hydrOXYzine 50 MG capsule Commonly known as: VISTARIL Take 50 mg by mouth in the morning and at bedtime.   ibuprofen 800 MG tablet Commonly known as: ADVIL Take 800 mg by mouth 3 (three) times daily as needed for painful procedure/intervention.   ipratropium-albuterol 0.5-2.5 (3) MG/3ML Soln Commonly known as: DUONEB Take 3 mLs by nebulization daily as needed (shortness of breath).   labetalol 200 MG tablet Commonly known as: NORMODYNE Take 200 mg by mouth 2 (two) times daily.   LORazepam 0.5 MG tablet Commonly known as:  ATIVAN Take 0.5 mg by mouth daily as needed for anxiety.   montelukast 10 MG tablet Commonly known as: SINGULAIR Take 10 mg by mouth at bedtime.   Nexplanon 68 MG Impl implant Generic drug: etonogestrel 68 mg by Subdermal route.   norethindrone-ethinyl estradiol 1-20 MG-MCG tablet Commonly known as: LOESTRIN Take 1 tablet by mouth daily.   omeprazole 40 MG capsule Commonly known as: PRILOSEC Take 40 mg by mouth daily with  breakfast.   ondansetron 4 MG/5ML solution Commonly known as: ZOFRAN Take 8 mg by mouth 2 (two) times daily as needed for nausea/vomiting.   sulfamethoxazole-trimethoprim 800-160 MG tablet Commonly known as: BACTRIM DS Take 1 tablet by mouth 2 (two) times daily.   tiZANidine 4 MG tablet Commonly known as: ZANAFLEX Take 1 tablet (4 mg total) by mouth every 8 (eight) hours as needed for muscle spasms. What changed:  when to take this reasons to take this   topiramate 50 MG tablet Commonly known as: TOPAMAX Take 100 mg by mouth at bedtime.         SignedKathaleen Maser Breanda Greenlaw 02/01/2023, 5:08 PM

## 2023-02-01 NOTE — Progress Notes (Signed)
Patient alert and oriented, mae's well, voiding adequate amount of urine, swallowing without difficulty, no c/o pain at time of discharge. Patient discharged home with family. Script and discharged instructions given to patient. Patient and family stated understanding of instructions given. Patient has an appointment with Dr. Pool  

## 2023-02-01 NOTE — Op Note (Signed)
Date of procedure: 02/01/2023  Date of dictation: Same  Service: Neurosurgery  Preoperative diagnosis: C5-6 herniated nucleus pulposus with stenosis  Postoperative diagnosis: Same  Procedure Name: C5-6 anterior cervical discectomy with interbody fusion utilizing interbody cage, local harvested autograft, and anterior plate instrumentation  Surgeon:Brandace Cargle A.Maye Parkinson, M.D.  Asst. Surgeon: Danielle Dess, MD; Doran Durand, NP  Anesthesia: General  Indication: 37 year old female with severe neck pain with radiation to her scapular region and intermittent radiation to both upper extremities.  Patient situation complicated with prior upper extremity orthopedic injury and complex regional pain syndrome into her upper extremities.  Patient presents now for C5-6 anterior cervical discectomy and fusion in hopes improving her symptoms.  Operative note: After induction of anesthesia, patient positioned supine with her neck slightly extended and held placeholder traction.  Patient's anterior cervical region prepped and draped sterilely.  Incision made overlying C5-6.  Dissection performed on the right side.  Retractor placed.  Fluoroscopy used.  Level confirmed.  Disc base and then incised with a 15 blade.  Discectomy then performed using various instruments down to level the posterior annulus.  Microscope was then brought into the field used throughout the remainder of the discectomy.  Remaining aspects of annulus and osteophytes were removed using a high-speed drill down to level the posterior longitudinal ligament.  Posterior logical was then elevated and resected in a piecemeal fashion exposing underlying thecal sac.  Wide central decompression including elements of the disc herniation were then performed by undercutting the bodies of C5 and C6.  Decompression then proceeded to each neural foramina.  Wide anterior foraminotomies performed on the course exiting C6 nerve roots bilaterally.  At this point a very thorough  decompression had been achieved.  There was no evidence of injury to the thecal sac or nerve roots.  Wound was then irrigated.  Gelfoam was placed topically for hemostasis then removed.  6 mm [employer] anatomic peek cage packed with locally harvested autograft was then impacted into place and recessed slightly from the anterior cortical margins of C5 and C6.  Atlantis anterior cervical plate was then placed over the C5 and C6 levels.  This then attached under fluoroscopic guidance using 13 mm variable angle screws to each at both levels.  All 4 screws given final tightening and found to be solidly within the bones.  Locking screws were engaged.  Final images reveal good position of the cage and the hardware at the proper operative level with normal alignment of the spine.  Wound is then irrigated.  Hemostasis was assured.  Wound is then closed in layers with Vicryl sutures.  Steri-Strips and sterile dressing were applied.  No apparent complications.  Patient tolerated the procedure well and she returns to the recovery room postop.

## 2023-02-01 NOTE — Brief Op Note (Signed)
02/01/2023  12:02 PM  PATIENT:  Joanna Zimmerman  37 y.o. female  PRE-OPERATIVE DIAGNOSIS:  Herniated Nucleus Pulposus  POST-OPERATIVE DIAGNOSIS:  Herniated Nucleus Pulposus  PROCEDURE:  Procedure(s): Anterior Cervical Decmpression Fusion  - Cervical five-Cervical six (N/A)  SURGEON:  Surgeon(s) and Role:    * Julio Sicks, MD - Primary    * Barnett Abu, MD - Assisting  PHYSICIAN ASSISTANT:   ASSISTANTSMarland Mcalpine   ANESTHESIA:   general  EBL:  50 cc  BLOOD ADMINISTERED:none  DRAINS: none   LOCAL MEDICATIONS USED:  NONE  SPECIMEN:  No Specimen  DISPOSITION OF SPECIMEN:  N/A  COUNTS:  YES  TOURNIQUET:  * No tourniquets in log *  DICTATION: .Dragon Dictation  PLAN OF CARE: Admit for overnight observation  PATIENT DISPOSITION:  PACU - hemodynamically stable.   Delay start of Pharmacological VTE agent (>24hrs) due to surgical blood loss or risk of bleeding: yes

## 2023-02-01 NOTE — Anesthesia Procedure Notes (Signed)
Procedure Name: Intubation Date/Time: 02/01/2023 10:46 AM  Performed by: Gaynelle Adu, MDPre-anesthesia Checklist: Patient identified, Emergency Drugs available, Suction available and Patient being monitored Patient Re-evaluated:Patient Re-evaluated prior to induction Oxygen Delivery Method: Circle system utilized Preoxygenation: Pre-oxygenation with 100% oxygen Induction Type: IV induction Ventilation: Oral airway inserted - appropriate to patient size and Two handed mask ventilation required Laryngoscope Size: Glidescope Grade View: Grade I Tube type: Oral Tube size: 7.0 mm Number of attempts: 1 Airway Equipment and Method: Oral airway, Rigid stylet and Video-laryngoscopy Placement Confirmation: ETT inserted through vocal cords under direct vision, positive ETCO2 and breath sounds checked- equal and bilateral Secured at: 22 cm Tube secured with: Tape Dental Injury: Teeth and Oropharynx as per pre-operative assessment

## 2023-02-01 NOTE — Discharge Instructions (Addendum)
Wound Care Keep incision covered and dry for three days.   Do not put any creams, lotions, or ointments on incision. Leave steri-strips on back.  They will fall off by themselves. Activity Walk each and every day, increasing distance each day. No lifting greater than 8 lbs.  Avoid excessive neck motion. No driving for 2 weeks; may ride as a passenger locally.  Diet Resume your normal diet.  Return to Work Will be discussed at you follow up appointment. Call Your Doctor If Any of These Occur Redness, drainage, or swelling at the wound.  Temperature greater than 101 degrees. Severe pain not relieved by pain medication. Incision starts to come apart. Follow Up Appt Call  [phone])  for problems.  If you have any hardware placed in your spine, you will need an x-ray before your appointment.  

## 2023-02-01 NOTE — Transfer of Care (Signed)
Immediate Anesthesia Transfer of Care Note  Patient: Joanna Zimmerman  Procedure(s) Performed: Anterior Cervical Decmpression Fusion  - Cervical five-Cervical six  Patient Location: PACU  Anesthesia Type:General  Level of Consciousness: oriented, drowsy, and patient cooperative  Airway & Oxygen Therapy: Patient Spontanous Breathing and Patient connected to face mask oxygen  Post-op Assessment: Report given to RN, Post -op Vital signs reviewed and stable, and Patient moving all extremities X 4  Post vital signs: Reviewed and stable  Last Vitals:  Vitals Value Taken Time  BP 125/68 02/01/23 1230  Temp 36.5 C 02/01/23 1230  Pulse 81 02/01/23 1231  Resp 24 02/01/23 1231  SpO2 97 % 02/01/23 1231  Vitals shown include unvalidated device data.  Last Pain:  Vitals:   02/01/23 0931  PainSc: 9          Complications: There were no known notable events for this encounter.

## 2023-02-01 NOTE — H&P (Signed)
Joanna Zimmerman is an 37 y.o. female.   Chief Complaint: Neck pain HPI: 37 year old female with a complex medical history including complex regional pain syndrome from prior orthopedic injury to her upper extremity.  Patient now presents with worsening neck and bilateral upper extremity pain failing conservative management.  Workup demonstrates evidence of a large central disc herniation at C5-6 with early cord compression but no signal abnormality.  Patient has failed conservative management presents now for C5-6 anterior cervical discectomy and fusion in hopes of improving her symptoms.  Past Medical History:  Diagnosis Date   2022    Anxiety    Asthma    COPD (chronic obstructive pulmonary disease) (HCC)    CRPS (complex regional pain syndrome type I)    Dysrhythmia    heart rate irregularities; Loop recorder 12/20/19   Ectopic pregnancy    Fibromyalgia    H/O multiple allergies    Hypertension    Migraines    O2 drop; needs hyperoxygenation before procedure    PCOS (polycystic ovarian syndrome)    PONV (postoperative nausea and vomiting)    Raynaud disease     Past Surgical History:  Procedure Laterality Date   BIOPSY  07/09/2022   Procedure: BIOPSY;  Surgeon: Charlott Rakes, MD;  Location: [facility];  Service: Gastroenterology;;   CHOLECYSTECTOMY     COLONOSCOPY WITH PROPOFOL N/A 07/09/2022   Procedure: COLONOSCOPY WITH PROPOFOL;  Surgeon: Charlott Rakes, MD;  Location: [facility];  Service: Gastroenterology;  Laterality: N/A;   ECTOPIC PREGNANCY SURGERY     ESOPHAGOGASTRODUODENOSCOPY (EGD) WITH PROPOFOL N/A 07/09/2022   Procedure: ESOPHAGOGASTRODUODENOSCOPY (EGD) WITH PROPOFOL;  Surgeon: Charlott Rakes, MD;  Location: [facility];  Service: Gastroenterology;  Laterality: N/A;  With possible Dilation   MEDIAL COLLATERAL LIGAMENT REPAIR, KNEE Left    TONSILLECTOMY      History reviewed. No pertinent family history. Social History:  reports that she has  quit smoking. Her smoking use included cigarettes. She has never used smokeless tobacco. She reports that she does not drink alcohol and does not use drugs.  Allergies:  Allergies  Allergen Reactions   Bromelains     Other reaction(s): Diarrhea   Capsicum Hives    Bell peppers   Cefdinir Anaphylaxis   Penicillins Anaphylaxis    All cillins or related to cillins   Tape Rash    Also allergic to paper tape Can use tegaderm    Wound Dressing Adhesive Rash    Also allergic to paper tape Surgical Glue   Oxybutynin Other (See Comments)    Dry mouth / "like swallowing nails"   Baclofen Nausea And Vomiting   Dilaudid [Hydromorphone] Nausea And Vomiting    Severe vomiting   Doxycycline Diarrhea   Keppra [Levetiracetam] Swelling   Levofloxacin Other (See Comments)    Muscles froze   Nifedipine Other (See Comments)    Hand tingling   Pineapple Nausea And Vomiting   Relafen [Nabumetone] Hives and Nausea And Vomiting   Robaxin [Methocarbamol] Nausea And Vomiting   Buspirone Rash   Venlafaxine Rash    Medications Prior to Admission  Medication Sig Dispense Refill   albuterol (VENTOLIN HFA) 108 (90 Base) MCG/ACT inhaler Inhale 2 puffs into the lungs every 6 (six) hours as needed for wheezing or shortness of breath.     amitriptyline (ELAVIL) 10 MG tablet Take 10 mg by mouth at bedtime.     desvenlafaxine (PRISTIQ) 100 MG 24 hr tablet Take 100 mg by mouth daily.  dicyclomine (BENTYL) 20 MG tablet Take 1 tablet (20 mg total) by mouth 2 (two) times daily as needed for spasms (abdominal cramping). 20 tablet 0   EPINEPHrine 0.3 mg/0.3 mL IJ SOAJ injection Inject 0.3 mg into the muscle as needed for anaphylaxis.     etonogestrel (NEXPLANON) 68 MG IMPL implant 68 mg by Subdermal route.     gabapentin (NEURONTIN) 300 MG capsule Take 900 mg by mouth 3 (three) times daily.     HYDROcodone-acetaminophen (NORCO) 10-325 MG tablet Take 1 tablet by mouth every 6 (six) hours.     hydrOXYzine  (VISTARIL) 50 MG capsule Take 50 mg by mouth in the morning and at bedtime.     ibuprofen (ADVIL) 800 MG tablet Take 800 mg by mouth 3 (three) times daily as needed for painful procedure/intervention.     ipratropium-albuterol (DUONEB) 0.5-2.5 (3) MG/3ML SOLN Take 3 mLs by nebulization daily as needed (shortness of breath).     labetalol (NORMODYNE) 200 MG tablet Take 200 mg by mouth 2 (two) times daily.     LORazepam (ATIVAN) 0.5 MG tablet Take 0.5 mg by mouth daily as needed for anxiety.     montelukast (SINGULAIR) 10 MG tablet Take 10 mg by mouth at bedtime.     norethindrone-ethinyl estradiol (LOESTRIN) 1-20 MG-MCG tablet Take 1 tablet by mouth daily.     omeprazole (PRILOSEC) 40 MG capsule Take 40 mg by mouth daily with breakfast.     ondansetron (ZOFRAN) 4 MG/5ML solution Take 8 mg by mouth 2 (two) times daily as needed for nausea/vomiting.     sulfamethoxazole-trimethoprim (BACTRIM DS) 800-160 MG tablet Take 1 tablet by mouth 2 (two) times daily.     tiZANidine (ZANAFLEX) 4 MG tablet Take 4 mg by mouth in the morning and at bedtime.     topiramate (TOPAMAX) 50 MG tablet Take 100 mg by mouth at bedtime.      Results for orders placed or performed during the hospital encounter of 02/01/23 (from the past 48 hour(s))  Pregnancy, urine POC     Status: None   Collection Time: 02/01/23  9:43 AM  Result Value Ref Range   Preg Test, Ur NEGATIVE NEGATIVE    Comment:        THE SENSITIVITY OF THIS METHODOLOGY IS >24 mIU/mL    No results found.  Pertinent items noted in HPI and remainder of comprehensive ROS otherwise negative.  Blood pressure 115/82, pulse 67, temperature 98.2 F (36.8 C), resp. rate 17, height 5\' 7"  (1.702 m), weight 122.5 kg, last menstrual period 11/21/2022, SpO2 95 %, currently breastfeeding.  Patient is awake and alert.  She is oriented and appropriate.  Speech is fluent.  Judgment insight are intact.  Cranial nerve function normal bilaterally motor examination  reveals intact motor strength bilaterally sensory examination with some patchy distal sensory loss and hyperesthesia in both upper extremities.  Reflexes are normal active.  No evidence of long track signs.  Gait and posture are reasonably normal peer examination head ears eyes nose throat is unremarkable chest and abdomen are benign.  Extremities are free from injury or deformity. 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  Plan C5-6 ACDF.  Risks and benefits been explained.  Patient wishes to proceed.  Sherilyn Cooter A Jeovani Weisenburger 02/01/2023, 10:12 AM

## 2023-02-02 ENCOUNTER — Encounter (HOSPITAL_COMMUNITY): Payer: Self-pay | Admitting: Neurosurgery

## 2023-02-18 ENCOUNTER — Encounter (HOSPITAL_BASED_OUTPATIENT_CLINIC_OR_DEPARTMENT_OTHER): Payer: Self-pay | Admitting: Emergency Medicine

## 2023-02-18 ENCOUNTER — Emergency Department (HOSPITAL_BASED_OUTPATIENT_CLINIC_OR_DEPARTMENT_OTHER)
Admission: EM | Admit: 2023-02-18 | Discharge: 2023-02-18 | Disposition: A | Discharge status: Home / Self Care | Payer: BC Managed Care – PPO | Attending: Emergency Medicine | Admitting: Emergency Medicine

## 2023-02-18 ENCOUNTER — Other Ambulatory Visit: Payer: Self-pay

## 2023-02-18 ENCOUNTER — Emergency Department (HOSPITAL_BASED_OUTPATIENT_CLINIC_OR_DEPARTMENT_OTHER): Payer: BC Managed Care – PPO

## 2023-02-18 DIAGNOSIS — J45909 Unspecified asthma, uncomplicated: Secondary | ICD-10-CM | POA: Diagnosis not present

## 2023-02-18 DIAGNOSIS — R0602 Shortness of breath: Secondary | ICD-10-CM | POA: Insufficient documentation

## 2023-02-18 LAB — COMPREHENSIVE METABOLIC PANEL
ALT: 20 U/L (ref 0–44)
AST: 19 U/L (ref 15–41)
Albumin: 3.8 g/dL (ref 3.5–5.0)
Alkaline Phosphatase: 69 U/L (ref 38–126)
Anion gap: 9 (ref 5–15)
BUN: 10 mg/dL (ref 6–20)
CO2: 18 mmol/L — ABNORMAL LOW (ref 22–32)
Calcium: 8.7 mg/dL — ABNORMAL LOW (ref 8.9–10.3)
Chloride: 108 mmol/L (ref 98–111)
Creatinine, Ser: 0.81 mg/dL (ref 0.44–1.00)
GFR, Estimated: >60 mL/min (ref >60)
Glucose, Bld: 92 mg/dL (ref 70–99)
Potassium: 3.5 mmol/L (ref 3.5–5.1)
Sodium: 135 mmol/L (ref 135–145)
Total Bilirubin: 0.3 mg/dL (ref 0.3–1.2)
Total Protein: 7.1 g/dL (ref 6.5–8.1)

## 2023-02-18 LAB — CBC WITH DIFFERENTIAL/PLATELET
Abs Immature Granulocytes: 0.01 10*3/uL (ref 0.00–0.07)
Basophils Absolute: 0.1 10*3/uL (ref 0.0–0.1)
Basophils Relative: 2 %
Eosinophils Absolute: 0.2 10*3/uL (ref 0.0–0.5)
Eosinophils Relative: 3 %
HCT: 40.5 % (ref 36.0–46.0)
Hemoglobin: 13.6 g/dL (ref 12.0–15.0)
Immature Granulocytes: 0 %
Lymphocytes Relative: 37 %
Lymphs Abs: 2.2 10*3/uL (ref 0.7–4.0)
MCH: 27.3 pg (ref 26.0–34.0)
MCHC: 33.6 g/dL (ref 30.0–36.0)
MCV: 81.3 fL (ref 80.0–100.0)
Monocytes Absolute: 0.5 10*3/uL (ref 0.1–1.0)
Monocytes Relative: 8 %
Neutro Abs: 3 10*3/uL (ref 1.7–7.7)
Neutrophils Relative %: 50 %
Platelets: 349 10*3/uL (ref 150–400)
RBC: 4.98 MIL/uL (ref 3.87–5.11)
RDW: 14.2 % (ref 11.5–15.5)
WBC: 6 10*3/uL (ref 4.0–10.5)
nRBC: 0 % (ref 0.0–0.2)

## 2023-02-18 LAB — PREGNANCY, URINE: Preg Test, Ur: NEGATIVE

## 2023-02-18 LAB — TROPONIN I (HIGH SENSITIVITY)
Troponin I (High Sensitivity): <2 ng/L (ref <18)
Troponin I (High Sensitivity): <2 ng/L (ref <18)

## 2023-02-18 LAB — BRAIN NATRIURETIC PEPTIDE: B Natriuretic Peptide: 11.2 pg/mL (ref 0.0–100.0)

## 2023-02-18 MED ORDER — FENTANYL CITRATE PF 50 MCG/ML IJ SOSY
50.0000 ug | PREFILLED_SYRINGE | Freq: Once | INTRAMUSCULAR | Status: AC
  Administered 2023-02-18: 50 ug via INTRAVENOUS
  Filled 2023-02-18: qty 1

## 2023-02-18 MED ORDER — IOHEXOL 350 MG/ML SOLN
75.0000 mL | Freq: Once | INTRAVENOUS | Status: AC | PRN
  Administered 2023-02-18: 150 mL via INTRAVENOUS

## 2023-02-18 NOTE — ED Notes (Signed)
Urine specimen in lab 

## 2023-02-18 NOTE — ED Provider Notes (Signed)
[place] [facility] Provider Note   CSN: [id number] Arrival date & time: 02/18/23  1134     History  Chief Complaint  Patient presents with   Shortness of Breath    Joanna Zimmerman is a 37 y.o. female.  Patient is a 37 year old female with a past medical history of recent cervical stenosis surgery about 2 weeks ago, fibromyalgia and asthma presenting to the emergency department with shortness of breath.  The patient states that since her surgery she has had some shortness of breath.  She states it is worsened over the last few days and also started to develop some right-sided chest pain over the last 2 days.  She states that the chest pain has been constant.  She states that she has been taking her albuterol inhaler without significant relief.  She denies any fevers or cough or lower extremity swelling.  She denies any history of blood clots.  She states that she is on an OCP.  The history is provided by the patient and the spouse.  Shortness of Breath      Home Medications Prior to Admission medications   Medication Sig Start Date End Date Taking? Authorizing Provider  albuterol (VENTOLIN HFA) 108 (90 Base) MCG/ACT inhaler Inhale 2 puffs into the lungs every 6 (six) hours as needed for wheezing or shortness of breath.    [provider]  amitriptyline (ELAVIL) 10 MG tablet Take 10 mg by mouth at bedtime.    [provider]  desvenlafaxine (PRISTIQ) 100 MG 24 hr tablet Take 100 mg by mouth daily. 12/20/22 12/20/23  [provider]  dicyclomine (BENTYL) 20 MG tablet Take 1 tablet (20 mg total) by mouth 2 (two) times daily as needed for spasms (abdominal cramping). 05/20/22   Mesner, Barbara Cower, MD  EPINEPHrine 0.3 mg/0.3 mL IJ SOAJ injection Inject 0.3 mg into the muscle as needed for anaphylaxis.    [provider]  etonogestrel (NEXPLANON) 68 MG IMPL implant 68 mg by Subdermal route.    [provider]   gabapentin (NEURONTIN) 300 MG capsule Take 900 mg by mouth 3 (three) times daily. 05/13/22   [provider]  HYDROcodone-acetaminophen (NORCO) 10-325 MG tablet Take 1 tablet by mouth every 6 (six) hours. 02/01/23   Julio Sicks, MD  hydrOXYzine (VISTARIL) 50 MG capsule Take 50 mg by mouth in the morning and at bedtime. 06/14/22   [provider]  ibuprofen (ADVIL) 800 MG tablet Take 800 mg by mouth 3 (three) times daily as needed for painful procedure/intervention. 04/08/22   [provider]  ipratropium-albuterol (DUONEB) 0.5-2.5 (3) MG/3ML SOLN Take 3 mLs by nebulization daily as needed (shortness of breath).    [provider]  labetalol (NORMODYNE) 200 MG tablet Take 200 mg by mouth 2 (two) times daily. 06/15/22   [provider]  LORazepam (ATIVAN) 0.5 MG tablet Take 0.5 mg by mouth daily as needed for anxiety. 12/27/22   [provider]  montelukast (SINGULAIR) 10 MG tablet Take 10 mg by mouth at bedtime.    [provider]  norethindrone-ethinyl estradiol (LOESTRIN) 1-20 MG-MCG tablet Take 1 tablet by mouth daily. 01/21/23   [provider]  omeprazole (PRILOSEC) 40 MG capsule Take 40 mg by mouth daily with breakfast.    [provider]  ondansetron (ZOFRAN) 4 MG/5ML solution Take 8 mg by mouth 2 (two) times daily as needed for nausea/vomiting. 06/15/22   [provider]  tiZANidine (ZANAFLEX) 4 MG  tablet Take 1 tablet (4 mg total) by mouth every 8 (eight) hours as needed for muscle spasms. 02/01/23   Julio Sicks, MD  topiramate (TOPAMAX) 50 MG tablet Take 100 mg by mouth at bedtime. 05/21/22   [provider]      Allergies    Bromelains, Capsicum, Cefdinir, Penicillins, Tape, Wound dressing adhesive, Oxybutynin, Baclofen, Dilaudid [hydromorphone], Doxycycline, Keppra [levetiracetam], Levofloxacin, Nifedipine, Pineapple, Relafen [nabumetone], Robaxin [methocarbamol], Buspirone, and Venlafaxine     Review of Systems   Review of Systems  Respiratory:  Positive for shortness of breath.     Physical Exam Updated Vital Signs BP (!) 131/91   Pulse 69   Temp 98.5 F (36.9 C) (Oral)   Resp 16   Ht 5\' 7"  (1.702 m)   Wt 122.5 kg   LMP 11/21/2022   SpO2 96%   BMI 42.30 kg/m  Physical Exam Vitals and nursing note reviewed.  Constitutional:      General: She is not in acute distress.    Appearance: She is well-developed.  HENT:     Head: Normocephalic and atraumatic.     Mouth/Throat:     Pharynx: Oropharynx is clear.  Eyes:     Extraocular Movements: Extraocular movements intact.  Cardiovascular:     Rate and Rhythm: Normal rate and regular rhythm.     Pulses: Normal pulses.     Heart sounds: Normal heart sounds.  Pulmonary:     Effort: No accessory muscle usage or respiratory distress.     Breath sounds: Normal breath sounds.     Comments: Mildly conversationally dyspneic Abdominal:     Palpations: Abdomen is soft.     Tenderness: There is no abdominal tenderness.  Musculoskeletal:        General: Normal range of motion.     Cervical back: Normal range of motion and neck supple.     Right lower leg: No edema.     Left lower leg: No edema.  Skin:    General: Skin is warm and dry.  Neurological:     General: No focal deficit present.     Mental Status: She is alert and oriented to person, place, and time.  Psychiatric:        Mood and Affect: Mood normal.        Behavior: Behavior normal.     ED Results / Procedures / Treatments   Labs (all labs ordered are listed, but only abnormal results are displayed) Labs Reviewed  COMPREHENSIVE METABOLIC PANEL - Abnormal; Notable for the following components:      Result Value   CO2 18 (*)    Calcium 8.7 (*)    All other components within normal limits  CBC WITH DIFFERENTIAL/PLATELET  BRAIN NATRIURETIC PEPTIDE  PREGNANCY, URINE  TROPONIN I (HIGH SENSITIVITY)  TROPONIN I (HIGH SENSITIVITY)    EKG EKG  Interpretation  Date/Time:  Friday February 18 2023 11:43:09 EDT Ventricular Rate:  86 PR Interval:  190 QRS Duration: 87 QT Interval:  369 QTC Calculation: 442 R Axis:   -28 Text Interpretation: Sinus rhythm Borderline left axis deviation Anterior infarct, old Baseline wander in lead(s) V5 No significant change since last tracing Confirmed by Elayne Snare ([id number]) on 02/18/2023 11:47:09 AM  Radiology CT Angio Chest PE W/Cm &/Or Wo Cm  Result Date: 02/18/2023 CLINICAL DATA:  Pulmonary embolus suspected with high probability. Neck surgery on 05/21. Worsening shortness of breath for 2 days. Right-sided chest pain. EXAM: CT ANGIOGRAPHY CHEST WITH CONTRAST TECHNIQUE: Multidetector  CT imaging of the chest was performed using the standard protocol during bolus administration of intravenous contrast. Multiplanar CT image reconstructions and MIPs were obtained to evaluate the vascular anatomy. RADIATION DOSE REDUCTION: This exam was performed according to the departmental dose-optimization program which includes automated exposure control, adjustment of the mA and/or kV according to patient size and/or use of iterative reconstruction technique. CONTRAST:  OMNIPAQUE IOHEXOL 350 MG/ML SOLN COMPARISON:  02/04/2022 FINDINGS: Cardiovascular: Motion artifact limits examination. There is moderately good opacification of the central and segmental pulmonary arteries. No focal filling defects are identified. No evidence of significant pulmonary embolus. Normal heart size. Normal caliber thoracic aorta. No aortic dissection. Mediastinum/Nodes: No enlarged mediastinal, hilar, or axillary lymph nodes. Thyroid gland, trachea, and esophagus demonstrate no significant findings. Lungs/Pleura: Motion artifact limits. Linear atelectasis or fibrosis in the lung bases, similar to prior study. No pleural effusions. No pneumothorax. Upper Abdomen: Diffuse fatty infiltration of the liver. No acute abnormalities demonstrated.  Musculoskeletal: Postoperative changes in the cervical spine. No acute bony abnormalities. Review of the MIP images confirms the above findings. IMPRESSION: 1. No evidence of significant pulmonary embolus. 2. No evidence of active pulmonary disease. Linear scarring or atelectasis in the lung bases is similar to prior study. Electronically Signed   By: Burman Nieves M.D.   On: 02/18/2023 15:17    Procedures Procedures    Medications Ordered in ED Medications  fentaNYL (SUBLIMAZE) injection 50 mcg (50 mcg Intravenous Given 02/18/23 1315)  iohexol (OMNIPAQUE) 350 MG/ML injection 75 mL (150 mLs Intravenous Contrast Given 02/18/23 1436)    ED Course/ Medical Decision Making/ A&P Clinical Course as of 02/18/23 1531  Fri Feb 18, 2023  1516 Stable  25 YOF with a chief complaint of SOB. Recent surgery and BC use PE study read pending If negative ok to go.  Likely low PESI. [CC]  1521 PE study back and negative [CC]    Clinical Course User Index [CC] Glyn Ade, MD                             Medical Decision Making This patient presents to the ED with chief complaint(s) of SOB with pertinent past medical history of recent neck surgery, asthma, fibromyalgia which further complicates the presenting complaint. The complaint involves an extensive differential diagnosis and also carries with it a high risk of complications and morbidity.    The differential diagnosis includes with recent surgery and hormone use concerning for PE, additional concern for ACS, arrhythmia, anemia, pneumonia, pneumothorax, pulmonary edema, pleural effusion radiation of pain with equal pulses in all 4 extremities making a dissection unlikely  Additional history obtained: Additional history obtained from spouse Records reviewed previous admission documents  ED Course and Reassessment: On patient's arrival to the emergency department she is hemodynamically stable in no acute distress.  EKG on arrival showed  normal sinus rhythm without acute ischemic changes.  Patient will have labs including troponin and will have a CT PE study in the setting of her recent operation as well as hormone use.  The patient will be closely reassessed.  Independent labs interpretation:  The following labs were independently interpreted: within normal range  Independent visualization of imaging: - I independently visualized the following imaging with scope of interpretation limited to determining acute life threatening conditions related to emergency care: CTPE, which revealed no acute disease  Consultation: - Consulted or discussed management/test interpretation w/ external professional: N/A  Consideration  for admission or further workup: Patient has no emergent conditions requiring admission or further work-up at this time and is stable for discharge home with primary care follow-up  Social Determinants of health: N/A    Amount and/or Complexity of Data Reviewed Labs: ordered. Radiology: ordered.  Risk Prescription drug management.          Final Clinical Impression(s) / ED Diagnoses Final diagnoses:  SOB (shortness of breath)    Rx / DC Orders ED Discharge Orders     None         Rexford Maus, DO 02/18/23 1531

## 2023-02-18 NOTE — Discharge Instructions (Signed)
You were seen in the emergency department for your chest pain and your shortness of breath.  Your workup showed no signs of blood clots, pneumonia or stress on your heart.  It is unclear what is causing your symptoms at this time but you can follow-up with your primary doctor to have your symptoms rechecked.  You should return to the emergency department if your having significantly worsening shortness of breath, worsening of your chest pain, you pass out or if you have any other new or concerning symptoms.

## 2023-02-18 NOTE — ED Triage Notes (Signed)
Had surgery on neck on 5/21 , started to have worsening sob the last 2 days and chest pain  on rt side , did take home albuteral, felt like she was wheezing

## 2023-03-12 ENCOUNTER — Emergency Department (HOSPITAL_COMMUNITY)
Admission: EM | Admit: 2023-03-12 | Discharge: 2023-03-12 | Disposition: A | Discharge status: Home / Self Care | Payer: BC Managed Care – PPO | Attending: Emergency Medicine | Admitting: Emergency Medicine

## 2023-03-12 ENCOUNTER — Other Ambulatory Visit: Payer: Self-pay

## 2023-03-12 ENCOUNTER — Encounter (HOSPITAL_COMMUNITY): Payer: Self-pay | Admitting: Emergency Medicine

## 2023-03-12 DIAGNOSIS — R197 Diarrhea, unspecified: Secondary | ICD-10-CM | POA: Diagnosis not present

## 2023-03-12 DIAGNOSIS — R112 Nausea with vomiting, unspecified: Secondary | ICD-10-CM | POA: Insufficient documentation

## 2023-03-12 DIAGNOSIS — J449 Chronic obstructive pulmonary disease, unspecified: Secondary | ICD-10-CM | POA: Insufficient documentation

## 2023-03-12 DIAGNOSIS — J45909 Unspecified asthma, uncomplicated: Secondary | ICD-10-CM | POA: Diagnosis not present

## 2023-03-12 LAB — CBC WITH DIFFERENTIAL/PLATELET
Abs Immature Granulocytes: 0.05 10*3/uL (ref 0.00–0.07)
Basophils Absolute: 0.1 10*3/uL (ref 0.0–0.1)
Basophils Relative: 1 %
Eosinophils Absolute: 0 10*3/uL (ref 0.0–0.5)
Eosinophils Relative: 0 %
HCT: 51.1 % — ABNORMAL HIGH (ref 36.0–46.0)
Hemoglobin: 17.1 g/dL — ABNORMAL HIGH (ref 12.0–15.0)
Immature Granulocytes: 0 %
Lymphocytes Relative: 5 %
Lymphs Abs: 0.7 10*3/uL (ref 0.7–4.0)
MCH: 27.4 pg (ref 26.0–34.0)
MCHC: 33.5 g/dL (ref 30.0–36.0)
MCV: 81.9 fL (ref 80.0–100.0)
Monocytes Absolute: 0.4 10*3/uL (ref 0.1–1.0)
Monocytes Relative: 3 %
Neutro Abs: 13.4 10*3/uL — ABNORMAL HIGH (ref 1.7–7.7)
Neutrophils Relative %: 91 %
Platelets: 417 10*3/uL — ABNORMAL HIGH (ref 150–400)
RBC: 6.24 MIL/uL — ABNORMAL HIGH (ref 3.87–5.11)
RDW: 14.6 % (ref 11.5–15.5)
WBC: 14.7 10*3/uL — ABNORMAL HIGH (ref 4.0–10.5)
nRBC: 0 % (ref 0.0–0.2)

## 2023-03-12 LAB — COMPREHENSIVE METABOLIC PANEL
ALT: 27 U/L (ref 0–44)
AST: 38 U/L (ref 15–41)
Albumin: 4.9 g/dL (ref 3.5–5.0)
Alkaline Phosphatase: 66 U/L (ref 38–126)
Anion gap: 15 (ref 5–15)
BUN: 15 mg/dL (ref 6–20)
CO2: 16 mmol/L — ABNORMAL LOW (ref 22–32)
Calcium: 9.8 mg/dL (ref 8.9–10.3)
Chloride: 110 mmol/L (ref 98–111)
Creatinine, Ser: 1.12 mg/dL — ABNORMAL HIGH (ref 0.44–1.00)
GFR, Estimated: >60 mL/min (ref >60)
Glucose, Bld: 149 mg/dL — ABNORMAL HIGH (ref 70–99)
Potassium: 3.9 mmol/L (ref 3.5–5.1)
Sodium: 141 mmol/L (ref 135–145)
Total Bilirubin: 1.1 mg/dL (ref 0.3–1.2)
Total Protein: 8.5 g/dL — ABNORMAL HIGH (ref 6.5–8.1)

## 2023-03-12 LAB — LIPASE, BLOOD: Lipase: 39 U/L (ref 11–51)

## 2023-03-12 LAB — HCG, QUANTITATIVE, PREGNANCY: hCG, Beta Chain, Quant, S: <1 m[IU]/mL (ref <5)

## 2023-03-12 MED ORDER — SODIUM CHLORIDE 0.9 % IV BOLUS
1000.0000 mL | Freq: Once | INTRAVENOUS | Status: AC
  Administered 2023-03-12: 1000 mL via INTRAVENOUS

## 2023-03-12 MED ORDER — DIPHENHYDRAMINE HCL 50 MG/ML IJ SOLN
12.5000 mg | Freq: Once | INTRAMUSCULAR | Status: AC
  Administered 2023-03-12: 12.5 mg via INTRAVENOUS
  Filled 2023-03-12: qty 1

## 2023-03-12 MED ORDER — ONDANSETRON HCL 4 MG/2ML IJ SOLN
4.0000 mg | Freq: Once | INTRAMUSCULAR | Status: AC
  Administered 2023-03-12: 4 mg via INTRAVENOUS
  Filled 2023-03-12: qty 2

## 2023-03-12 MED ORDER — GABAPENTIN 300 MG PO CAPS
900.0000 mg | ORAL_CAPSULE | Freq: Once | ORAL | Status: AC
  Administered 2023-03-12: 900 mg via ORAL
  Filled 2023-03-12: qty 3

## 2023-03-12 MED ORDER — ONDANSETRON 4 MG PO TBDP
4.0000 mg | ORAL_TABLET | Freq: Once | ORAL | Status: AC | PRN
  Administered 2023-03-12: 4 mg via ORAL
  Filled 2023-03-12: qty 1

## 2023-03-12 MED ORDER — FENTANYL CITRATE PF 50 MCG/ML IJ SOSY
75.0000 ug | PREFILLED_SYRINGE | Freq: Once | INTRAMUSCULAR | Status: AC
  Administered 2023-03-12: 75 ug via INTRAVENOUS
  Filled 2023-03-12: qty 2

## 2023-03-12 MED ORDER — FAMOTIDINE IN NACL 20-0.9 MG/50ML-% IV SOLN
20.0000 mg | Freq: Once | INTRAVENOUS | Status: AC
  Administered 2023-03-12: 20 mg via INTRAVENOUS
  Filled 2023-03-12: qty 50

## 2023-03-12 MED ORDER — METOCLOPRAMIDE HCL 5 MG/ML IJ SOLN
10.0000 mg | INTRAMUSCULAR | Status: AC
  Administered 2023-03-12: 10 mg via INTRAVENOUS
  Filled 2023-03-12: qty 2

## 2023-03-12 MED ORDER — LOPERAMIDE HCL 2 MG PO CAPS
4.0000 mg | ORAL_CAPSULE | Freq: Once | ORAL | Status: AC
  Administered 2023-03-12: 4 mg via ORAL
  Filled 2023-03-12: qty 2

## 2023-03-12 NOTE — ED Notes (Signed)
Pt given water and sprite per request.

## 2023-03-12 NOTE — ED Provider Notes (Signed)
[facility] AT [facility] Provider Note  CSN: [id number] Arrival date & time: 03/12/23 0013  Chief Complaint(s) Allergic Reaction  HPI Joanna Zimmerman is a 37 y.o. female with a past medical history listed below who presents to the emergency department for nausea, vomiting, diarrhea for 12 hours believed to be a reaction from possible contaminant with pineapples.  Patient reports that she got a smoothie.  She drank approximately 6 to 8 ounces of the drink and began to feel bad.  No associated rash, shortness of breath, swelling.  Patient has been unable to tolerate p.o. intake.  She has tried taking her home antiemetics with no relief.  Patient has missed her pain medicine, metoprolol and other medications due to her inability to tolerate p.o.  She is complaining of abdominal discomfort but attributes this to the vomiting.  The history is provided by the patient.    Past Medical History Past Medical History:  Diagnosis Date   2022    Anxiety    Asthma    COPD (chronic obstructive pulmonary disease) (HCC)    CRPS (complex regional pain syndrome type I)    Dysrhythmia    heart rate irregularities; Loop recorder 12/20/19   Ectopic pregnancy    Fibromyalgia    H/O multiple allergies    Hypertension    Migraines    O2 drop; needs hyperoxygenation before procedure    PCOS (polycystic ovarian syndrome)    PONV (postoperative nausea and vomiting)    Raynaud disease    Patient Active Problem List   Diagnosis Date Noted   Cervical stenosis of spinal canal 02/01/2023   Diarrhea 07/09/2022   Dysphagia 07/09/2022   Rectal bleeding 07/09/2022   Epigastric abdominal pain 07/09/2022   Home Medication(s) Prior to Admission medications   Medication Sig Start Date End Date Taking? Authorizing Provider  albuterol (VENTOLIN HFA) 108 (90 Base) MCG/ACT inhaler Inhale 2 puffs into the lungs every 6 (six) hours as needed for wheezing or shortness of breath.     [provider]  amitriptyline (ELAVIL) 10 MG tablet Take 10 mg by mouth at bedtime.    [provider]  desvenlafaxine (PRISTIQ) 100 MG 24 hr tablet Take 100 mg by mouth daily. 12/20/22 12/20/23  [provider]  dicyclomine (BENTYL) 20 MG tablet Take 1 tablet (20 mg total) by mouth 2 (two) times daily as needed for spasms (abdominal cramping). 05/20/22   Mesner, Barbara Cower, MD  EPINEPHrine 0.3 mg/0.3 mL IJ SOAJ injection Inject 0.3 mg into the muscle as needed for anaphylaxis.    [provider]  etonogestrel (NEXPLANON) 68 MG IMPL implant 68 mg by Subdermal route.    [provider]  gabapentin (NEURONTIN) 300 MG capsule Take 900 mg by mouth 3 (three) times daily. 05/13/22   [provider]  HYDROcodone-acetaminophen (NORCO) 10-325 MG tablet Take 1 tablet by mouth every 6 (six) hours. 02/01/23   Julio Sicks, MD  hydrOXYzine (VISTARIL) 50 MG capsule Take 50 mg by mouth in the morning and at bedtime. 06/14/22   [provider]  ibuprofen (ADVIL) 800 MG tablet Take 800 mg by mouth 3 (three) times daily as needed for painful procedure/intervention. 04/08/22   [provider]  ipratropium-albuterol (DUONEB) 0.5-2.5 (3) MG/3ML SOLN Take 3 mLs by nebulization daily as needed (shortness of breath).    [provider]  labetalol (NORMODYNE) 200 MG tablet Take 200 mg by mouth 2 (two) times daily. 06/15/22   [provider]  LORazepam (ATIVAN) 0.5 MG tablet Take 0.5 mg by mouth daily as needed for anxiety. 12/27/22   [provider]  montelukast (SINGULAIR) 10 MG tablet Take 10 mg by mouth at bedtime.    [provider]  norethindrone-ethinyl estradiol (LOESTRIN) 1-20 MG-MCG tablet Take 1 tablet by mouth daily. 01/21/23   [provider]  omeprazole (PRILOSEC) 40 MG capsule Take 40 mg by mouth daily with breakfast.    [provider]  ondansetron (ZOFRAN) 4 MG/5ML solution Take 8 mg by mouth 2 (two)  times daily as needed for nausea/vomiting. 06/15/22   [provider]  tiZANidine (ZANAFLEX) 4 MG tablet Take 1 tablet (4 mg total) by mouth every 8 (eight) hours as needed for muscle spasms. 02/01/23   Julio Sicks, MD  topiramate (TOPAMAX) 50 MG tablet Take 100 mg by mouth at bedtime. 05/21/22   [provider]                                                                                                                                    Allergies Bromelains, Capsicum, Cefdinir, Penicillins, Tape, Wound dressing adhesive, Oxybutynin, Baclofen, Dilaudid [hydromorphone], Doxycycline, Keppra [levetiracetam], Levofloxacin, Nifedipine, Pineapple, Relafen [nabumetone], Robaxin [methocarbamol], Buspirone, and Venlafaxine  Review of Systems Review of Systems As noted in HPI  Physical Exam Vital Signs  I have reviewed the triage vital signs BP (!) 135/93   Pulse (!) 120   Temp 98.1 F (36.7 C)   Resp 18   Ht 5\' 7"  (1.702 m)   Wt 122.5 kg   SpO2 94%   BMI 42.29 kg/m   Physical Exam Vitals reviewed.  Constitutional:      General: She is not in acute distress.    Appearance: She is well-developed. She is obese. She is not diaphoretic.  HENT:     Head: Normocephalic and atraumatic.     Right Ear: External ear normal.     Left Ear: External ear normal.     Nose: Nose normal.  Eyes:     General: No scleral icterus.    Conjunctiva/sclera: Conjunctivae normal.  Neck:     Trachea: Phonation normal.  Cardiovascular:     Rate and Rhythm: Normal rate and regular rhythm.  Pulmonary:     Effort: Pulmonary effort is normal. No respiratory distress.     Breath sounds: No stridor.  Abdominal:     General: There is no distension.     Tenderness: There is generalized abdominal tenderness (mild). There is no guarding or rebound.  Musculoskeletal:        General: Normal range of motion.     Cervical back: Normal range of motion.  Neurological:     Mental Status: She is alert  and oriented to person, place, and time.  Psychiatric:        Behavior: Behavior normal.     ED Results and Treatments Labs (all labs ordered are  listed, but only abnormal results are displayed) Labs Reviewed  CBC WITH DIFFERENTIAL/PLATELET - Abnormal; Notable for the following components:      Result Value   WBC 14.7 (*)    RBC 6.24 (*)    Hemoglobin 17.1 (*)    HCT 51.1 (*)    Platelets 417 (*)    Neutro Abs 13.4 (*)    All other components within normal limits  COMPREHENSIVE METABOLIC PANEL - Abnormal; Notable for the following components:   CO2 16 (*)    Glucose, Bld 149 (*)    Creatinine, Ser 1.12 (*)    Total Protein 8.5 (*)    All other components within normal limits  LIPASE, BLOOD  HCG, QUANTITATIVE, PREGNANCY                                                                                                                         EKG  EKG Interpretation Date/Time:    Ventricular Rate:    PR Interval:    QRS Duration:    QT Interval:    QTC Calculation:   R Axis:      Text Interpretation:         Radiology No results found.  Medications Ordered in ED Medications  ondansetron (ZOFRAN-ODT) disintegrating tablet 4 mg (4 mg Oral Given 03/12/23 0039)  sodium chloride 0.9 % bolus 1,000 mL (0 mLs Intravenous Stopped 03/12/23 0334)  metoCLOPramide (REGLAN) injection 10 mg (10 mg Intravenous Given 03/12/23 0229)  fentaNYL (SUBLIMAZE) injection 75 mcg (75 mcg Intravenous Given 03/12/23 0330)  ondansetron (ZOFRAN) injection 4 mg (4 mg Intravenous Given 03/12/23 0330)  sodium chloride 0.9 % bolus 1,000 mL (0 mLs Intravenous Stopped 03/12/23 0520)  famotidine (PEPCID) IVPB 20 mg premix (0 mg Intravenous Stopped 03/12/23 0520)  diphenhydrAMINE (BENADRYL) injection 12.5 mg (12.5 mg Intravenous Given 03/12/23 0423)  gabapentin (NEURONTIN) capsule 900 mg (900 mg Oral Given 03/12/23 0547)  ondansetron (ZOFRAN) injection 4 mg (4 mg Intravenous Given 03/12/23 0535)  loperamide  (IMODIUM) capsule 4 mg (4 mg Oral Given 03/12/23 0547)   Procedures Procedures  (including critical care time) Medical Decision Making / ED Course   Medical Decision Making Amount and/or Complexity of Data Reviewed Labs: ordered. Decision-making details documented in ED Course.  Risk Prescription drug management. Parenteral controlled substances.    Patient presents with nausea, vomiting, diarrhea. Possible allergic reaction for pineapple contaminated. Could also be viral gastroenteritis..  Patient is afebrile but tachycardic. Given IV fluids and antiemetics.  CBC with leukocytosis but likely hemoconcentrated given elevated hemoglobin and hematocrit. Metabolic panel without significant electrolyte derangements or renal sufficiency.  Acidosis likely from GI losses.  Hyperglycemia without evidence of DKA.  No evidence of bili obstruction or pancreatitis. hCG obtained to rule out pregnancy related process and to assist in care management.  This was negative.  Patient given additional antiemetics.  Also given Benadryl and Pepcid.  Patient given small dose of IV pain medicine as well.  Patient able  to tolerate p.o.. Patient felt better and requested to be discharged.    Final Clinical Impression(s) / ED Diagnoses Final diagnoses:  Nausea vomiting and diarrhea   The patient appears reasonably screened and/or stabilized for discharge and I doubt any other medical condition or other [facility] requiring further screening, evaluation, or treatment in the ED at this time. I have discussed the findings, Dx and Tx plan with the patient/family who expressed understanding and agree(s) with the plan. Discharge instructions discussed at length. The patient/family was given strict return precautions who verbalized understanding of the instructions. No further questions at time of discharge.  Disposition: Discharge  Condition: Good  ED Discharge Orders     None        Follow Up: Leola Brazil, DO [address] [place] [address] [phone]  Call  to schedule an appointment for close follow up    This chart was dictated using voice recognition software.  Despite best efforts to proofread,  errors can occur which can change the documentation meaning.    Nira Conn, MD 03/12/23 [phone]

## 2023-03-12 NOTE — ED Triage Notes (Addendum)
Pt to ED via [facility] from home c/o allergic reaction.  States had smoothie around 1300 today that had pineapple in it which she has allergy to.  Pt has been vomiting since, more than 20 episodes at this time, skin clammy, denies swelling or trouble breathing.  Pt tried taking benadryl at home but threw it up.

## 2023-03-25 ENCOUNTER — Other Ambulatory Visit (HOSPITAL_COMMUNITY): Payer: Self-pay | Admitting: Internal Medicine

## 2023-03-25 DIAGNOSIS — R6881 Early satiety: Secondary | ICD-10-CM

## 2023-04-08 ENCOUNTER — Encounter
Admission: RE | Admit: 2023-04-08 | Discharge: 2023-04-08 | Disposition: A | Discharge status: Home / Self Care | Payer: BC Managed Care – PPO | Source: Ambulatory Visit | Attending: Internal Medicine | Admitting: Internal Medicine

## 2023-04-08 DIAGNOSIS — R6881 Early satiety: Secondary | ICD-10-CM | POA: Diagnosis not present

## 2023-04-08 MED ORDER — TECHNETIUM TC 99M SULFUR COLLOID
2.0000 | Freq: Once | INTRAVENOUS | Status: AC | PRN
  Administered 2023-04-08: 2.1 via ORAL

## 2023-04-11 ENCOUNTER — Other Ambulatory Visit: Payer: Self-pay | Admitting: Neurosurgery

## 2023-04-11 DIAGNOSIS — M5414 Radiculopathy, thoracic region: Secondary | ICD-10-CM

## 2023-04-30 ENCOUNTER — Other Ambulatory Visit: Payer: Self-pay

## 2023-04-30 ENCOUNTER — Ambulatory Visit
Admission: RE | Admit: 2023-04-30 | Discharge: 2023-04-30 | Disposition: A | Discharge status: Home / Self Care | Payer: BC Managed Care – PPO | Source: Ambulatory Visit | Attending: Family Medicine | Admitting: Family Medicine

## 2023-04-30 ENCOUNTER — Ambulatory Visit: Payer: BC Managed Care – PPO

## 2023-04-30 VITALS — BP 106/62 | HR 90 | Temp 98.5°F | Resp 16

## 2023-04-30 DIAGNOSIS — R059 Cough, unspecified: Secondary | ICD-10-CM | POA: Diagnosis not present

## 2023-04-30 DIAGNOSIS — J441 Chronic obstructive pulmonary disease with (acute) exacerbation: Secondary | ICD-10-CM | POA: Diagnosis not present

## 2023-04-30 DIAGNOSIS — J209 Acute bronchitis, unspecified: Secondary | ICD-10-CM

## 2023-04-30 MED ORDER — PROMETHAZINE-DM 6.25-15 MG/5ML PO SYRP: 5.0000 mL | ORAL_SOLUTION | Freq: Four times a day (QID) | ORAL | 0 refills | Status: DC | PRN

## 2023-04-30 MED ORDER — PREDNISONE 10 MG (21) PO TBPK: ORAL_TABLET | Freq: Every day | ORAL | 0 refills | Status: DC

## 2023-04-30 MED ORDER — CLARITHROMYCIN 500 MG PO TABS: 500.0000 mg | ORAL_TABLET | Freq: Two times a day (BID) | ORAL | 0 refills | Status: DC

## 2023-04-30 NOTE — ED Provider Notes (Signed)
[facility]    CSN: [id number] Arrival date & time: 04/30/23  1333      History   Chief Complaint Chief Complaint  Patient presents with   Cough    Severe cough rattling in chest. - Entered by patient    HPI Joanna Zimmerman is a 37 y.o. female.   HPI  Patient has a history of asthma and COPD.  She states that she has frequent pneumonia and bronchitis.  She is here because she has had a severe cough for the last couple of weeks.  It is keeping her awake at night.  Recently she developed "rattling in my chest".  She is starting to feel tired.  No fever or chills.  She states that when she coughs she has pain in her anterior chest.  She is not short of breath.  She is using her inhalers.  Past Medical History:  Diagnosis Date   Anxiety    Asthma    CAP    COPD (chronic obstructive pulmonary disease) (HCC)    CRPS (complex regional pain syndrome type I)    Dysrhythmia    heart rate irregularities; Loop recorder 12/20/19   Ectopic pregnancy    Fibromyalgia    H/O multiple allergies    Hypertension    Migraines    O2 drop; needs hyperoxygenation before procedure    PCOS (polycystic ovarian syndrome)    PONV (postoperative nausea and vomiting)    Raynaud disease     Patient Active Problem List   Diagnosis Date Noted   Cervical stenosis of spinal canal 02/01/2023   Diarrhea 07/09/2022   Dysphagia 07/09/2022   Rectal bleeding 07/09/2022   Epigastric abdominal pain 07/09/2022    Past Surgical History:  Procedure Laterality Date   ANTERIOR CERVICAL DECOMP/DISCECTOMY FUSION N/A 02/01/2023   Procedure: Anterior Cervical Decmpression Fusion  - Cervical five-Cervical six;  Surgeon: Julio Sicks, MD;  Location: [facility] OR;  Service: Neurosurgery;  Laterality: N/A;   BIOPSY  07/09/2022   Procedure: BIOPSY;  Surgeon: Charlott Rakes, MD;  Location: [facility] ENDOSCOPY;  Service: Gastroenterology;;   CHOLECYSTECTOMY     COLONOSCOPY WITH PROPOFOL N/A 07/09/2022   Procedure:  COLONOSCOPY WITH PROPOFOL;  Surgeon: Charlott Rakes, MD;  Location: [facility]OS[facility];  Service: Gastroenterology;  Laterality: N/A;   ECTOPIC PREGNANCY SURGERY     ESOPHAGOGASTRODUODENOSCOPY (EGD) WITH PROPOFOL N/A 07/09/2022   Procedure: ESOPHAGOGASTRODUODENOSCOPY (EGD) WITH PROPOFOL;  Surgeon: Charlott Rakes, MD;  Location: [facility]COPY;  Service: Gastroenterology;  Laterality: N/A;  With possible Dilation   MEDIAL COLLATERAL LIGAMENT REPAIR, KNEE Left    TONSILLECTOMY      OB History     Gravida  3   Para  1   Term      Preterm  1   AB  2   Living  1      SAB  2   IAB      Ectopic      Multiple      Live Births  1            Home Medications    Prior to Admission medications   Medication Sig Start Date End Date Taking? Authorizing Provider  clarithromycin (BIAXIN) 500 MG tablet Take 1 tablet (500 mg total) by mouth 2 (two) times daily. 04/30/23  Yes Eustace Moore, MD  predniSONE (STERAPRED UNI-PAK 21 TAB) 10 MG (21) TBPK tablet Take by mouth daily. Take 6 tabs by mouth daily  for 2 days, then 5  tabs for 2 days, then 4 tabs for 2 days, then 3 tabs for 2 days, 2 tabs for 2 days, then 1 tab by mouth daily for 2 days 04/30/23  Yes Eustace Moore, MD  promethazine-dextromethorphan (PROMETHAZINE-DM) 6.25-15 MG/5ML syrup Take 5 mLs by mouth 4 (four) times daily as needed for cough. 04/30/23  Yes Eustace Moore, MD  albuterol (VENTOLIN HFA) 108 (90 Base) MCG/ACT inhaler Inhale 2 puffs into the lungs every 6 (six) hours as needed for wheezing or shortness of breath.    [provider]  amitriptyline (ELAVIL) 10 MG tablet Take 10 mg by mouth at bedtime.    [provider]  desvenlafaxine (PRISTIQ) 100 MG 24 hr tablet Take 100 mg by mouth daily. 12/20/22 12/20/23  [provider]  dicyclomine (BENTYL) 20 MG tablet Take 1 tablet (20 mg total) by mouth 2 (two) times daily as needed for spasms (abdominal cramping). 05/20/22   Mesner, Barbara Cower, MD   EPINEPHrine 0.3 mg/0.3 mL IJ SOAJ injection Inject 0.3 mg into the muscle as needed for anaphylaxis.    [provider]  etonogestrel (NEXPLANON) 68 MG IMPL implant 68 mg by Subdermal route.    [provider]  gabapentin (NEURONTIN) 300 MG capsule Take 900 mg by mouth 3 (three) times daily. 05/13/22   [provider]  HYDROcodone-acetaminophen (NORCO) 10-325 MG tablet Take 1 tablet by mouth every 6 (six) hours. 02/01/23   Julio Sicks, MD  hydrOXYzine (VISTARIL) 50 MG capsule Take 50 mg by mouth in the morning and at bedtime. 06/14/22   [provider]  ibuprofen (ADVIL) 800 MG tablet Take 800 mg by mouth 3 (three) times daily as needed for painful procedure/intervention. 04/08/22   [provider]  ipratropium-albuterol (DUONEB) 0.5-2.5 (3) MG/3ML SOLN Take 3 mLs by nebulization daily as needed (shortness of breath).    [provider]  labetalol (NORMODYNE) 200 MG tablet Take 200 mg by mouth 2 (two) times daily. 06/15/22   [provider]  LORazepam (ATIVAN) 0.5 MG tablet Take 0.5 mg by mouth daily as needed for anxiety. 12/27/22   [provider]  montelukast (SINGULAIR) 10 MG tablet Take 10 mg by mouth at bedtime.    [provider]  norethindrone-ethinyl estradiol (LOESTRIN) 1-20 MG-MCG tablet Take 1 tablet by mouth daily. 01/21/23   [provider]  omeprazole (PRILOSEC) 40 MG capsule Take 40 mg by mouth daily with breakfast.    [provider]  ondansetron (ZOFRAN) 4 MG/5ML solution Take 8 mg by mouth 2 (two) times daily as needed for nausea/vomiting. 06/15/22   [provider]  tiZANidine (ZANAFLEX) 4 MG tablet Take 1 tablet (4 mg total) by mouth every 8 (eight) hours as needed for muscle spasms. 02/01/23   Julio Sicks, MD  topiramate (TOPAMAX) 50 MG tablet Take 100 mg by mouth at bedtime. 05/21/22   [provider]    Family History History reviewed. No pertinent family  history.  Social History Social History   Tobacco Use   Smoking status: Former    Types: Cigarettes   Smokeless tobacco: Never  Vaping Use   Vaping status: Never Used  Substance Use Topics   Alcohol use: No   Drug use: No     Allergies   Capsicum, Cefdinir, Penicillins, Wound dressing adhesive, Oxybutynin, Tape, Baclofen, Dilaudid [hydromorphone], Doxycycline, Keppra [levetiracetam], Levofloxacin, Nifedipine, Pineapple, Relafen [nabumetone], Robaxin [methocarbamol], Bromelains, Buspirone, and Venlafaxine   Review of Systems Review of Systems See HPI  Physical  Exam Triage Vital Signs ED Triage Vitals  Encounter Vitals Group     BP 04/30/23 1346 106/62     Systolic BP Percentile --      Diastolic BP Percentile --      Pulse Rate 04/30/23 1346 90     Resp 04/30/23 1346 16     Temp 04/30/23 1346 98.5 F (36.9 C)     Temp Source 04/30/23 1346 Oral     SpO2 04/30/23 1346 95 %     Weight --      Height --      Head Circumference --      Peak Flow --      Pain Score 04/30/23 1347 8     Pain Loc --      Pain Education --      Exclude from Growth Chart --    No data found.  Updated Vital Signs BP 106/62 (BP Location: Left Arm)   Pulse 90   Temp 98.5 F (36.9 C) (Oral)   Resp 16   SpO2 95%      Physical Exam Constitutional:      General: She is not in acute distress.    Appearance: She is well-developed. She is obese.  HENT:     Head: Normocephalic and atraumatic.  Eyes:     Conjunctiva/sclera: Conjunctivae normal.     Pupils: Pupils are equal, round, and reactive to light.  Cardiovascular:     Rate and Rhythm: Normal rate and regular rhythm.     Heart sounds: Normal heart sounds.  Pulmonary:     Effort: Pulmonary effort is normal. No respiratory distress.     Breath sounds: Normal breath sounds.     Comments: Few rhonchi centrally Abdominal:     General: There is no distension.     Palpations: Abdomen is soft.  Musculoskeletal:        General:  Normal range of motion.     Cervical back: Normal range of motion.  Skin:    General: Skin is warm and dry.  Neurological:     Mental Status: She is alert.      UC Treatments / Results  Labs (all labs ordered are listed, but only abnormal results are displayed) Labs Reviewed - No data to display  EKG   Radiology DG Chest 2 View  Result Date: 04/30/2023 CLINICAL DATA:  Cough, congestion for 2 weeks EXAM: CHEST - 2 VIEW COMPARISON:  01/03/2022 FINDINGS: No focal consolidation. No pleural effusion or pneumothorax. Heart and mediastinal contours are unremarkable. No acute osseous abnormality. IMPRESSION: No active cardiopulmonary disease. Electronically Signed   By: Elige Ko M.D.   On: 04/30/2023 14:43    Procedures Procedures (including critical care time)  Medications Ordered in UC Medications - No data to display  Initial Impression / Assessment and Plan / UC Course  I have reviewed the triage vital signs and the nursing notes.  Pertinent labs & imaging results that were available during my care of the patient were reviewed by me and considered in my medical decision making (see chart for details).    Patient has multiple allergies to multiple medications.  She cannot take azithromycin but states she "needs it for 10 days".  Therefore we will treat her with Biaxin.  Prednisone for the COPD flare.  Phenergan DM for cough.  See PCP in follow-up Final Clinical Impressions(s) / UC Diagnoses   Final diagnoses:  Acute bronchitis, unspecified organism  COPD exacerbation (HCC)  Discharge Instructions      Drink lots of water Take the antibiotic 2 times a day with food Take the prednisone as directed I have prescribed Phenergan DM to take for cough.  This may cause drowsiness.  It is useful at night Call your doctor if not improving in a few days     ED Prescriptions     Medication Sig Dispense Auth. Provider   clarithromycin (BIAXIN) 500 MG tablet Take 1  tablet (500 mg total) by mouth 2 (two) times daily. 20 tablet Eustace Moore, MD   predniSONE (STERAPRED UNI-PAK 21 TAB) 10 MG (21) TBPK tablet Take by mouth daily. Take 6 tabs by mouth daily  for 2 days, then 5 tabs for 2 days, then 4 tabs for 2 days, then 3 tabs for 2 days, 2 tabs for 2 days, then 1 tab by mouth daily for 2 days 42 tablet Eustace Moore, MD   promethazine-dextromethorphan (PROMETHAZINE-DM) 6.25-15 MG/5ML syrup Take 5 mLs by mouth 4 (four) times daily as needed for cough. 118 mL Eustace Moore, MD      PDMP not reviewed this encounter.   Eustace Moore, MD 04/30/23 [phone]

## 2023-04-30 NOTE — ED Triage Notes (Signed)
Cough, worse when lying down x 2 weeks. No relief with mucinex or delsym. No fever.

## 2023-04-30 NOTE — Discharge Instructions (Addendum)
Drink lots of water Take the antibiotic 2 times a day with food Take the prednisone as directed I have prescribed Phenergan DM to take for cough.  This may cause drowsiness.  It is useful at night Call your doctor if not improving in a few days

## 2023-05-01 ENCOUNTER — Ambulatory Visit: Payer: BC Managed Care – PPO

## 2023-05-15 ENCOUNTER — Other Ambulatory Visit: Payer: BC Managed Care – PPO

## 2023-05-20 ENCOUNTER — Encounter: Payer: Self-pay | Admitting: Neurosurgery

## 2023-05-23 ENCOUNTER — Other Ambulatory Visit: Payer: BC Managed Care – PPO

## 2023-05-26 ENCOUNTER — Ambulatory Visit
Admission: EM | Admit: 2023-05-26 | Discharge: 2023-05-26 | Disposition: A | Discharge status: Home / Self Care | Payer: BC Managed Care – PPO | Attending: Internal Medicine | Admitting: Internal Medicine

## 2023-05-26 DIAGNOSIS — R519 Headache, unspecified: Secondary | ICD-10-CM

## 2023-05-26 DIAGNOSIS — L304 Erythema intertrigo: Secondary | ICD-10-CM

## 2023-05-26 MED ORDER — KETOROLAC TROMETHAMINE 30 MG/ML IJ SOLN
30.0000 mg | Freq: Once | INTRAMUSCULAR | Status: AC
  Administered 2023-05-26: 30 mg via INTRAMUSCULAR

## 2023-05-26 MED ORDER — CLOTRIMAZOLE-BETAMETHASONE 1-0.05 % EX CREA
TOPICAL_CREAM | CUTANEOUS | 0 refills | Status: AC
Start: 2023-05-26 — End: ?

## 2023-05-26 MED ORDER — DEXAMETHASONE SODIUM PHOSPHATE 10 MG/ML IJ SOLN
6.0000 mg | Freq: Once | INTRAMUSCULAR | Status: AC
  Administered 2023-05-26: 6 mg via INTRAMUSCULAR

## 2023-05-26 NOTE — ED Triage Notes (Signed)
Pt presents to UC w/ c/o headache starting 2 weeks ago, which turned into a migraine for the last week. Pt has tried ice caps, tylenol, ibuprofen, and increasing her topamax to twice a day without relief. Pain is worse at night.  Pt states she was in aquatherapy this morning and noticed a painful rash under her left armpit.

## 2023-05-26 NOTE — Discharge Instructions (Addendum)
You were given a Toradol injection in clinic today. Do not take any over the counter NSAID's such as Advil, ibuprofen, Aleve, or naproxen for 24 hours.  You may take tylenol if needed Start Lotrisone cream to the rash under your arm.  Please keep the area clean and dry as possible.  Follow-up with your PCP in 2 days for recheck.  Also follow-up with your neurologist at your scheduled appointment.  Please go to the emergency room if you develop any worsening symptoms.  I hope you feel better soon!

## 2023-05-26 NOTE — ED Provider Notes (Signed)
[facility]    CSN: [id number] Arrival date & time: 05/26/23  1344      History   Chief Complaint No chief complaint on file.   HPI Royalle Flad is a 37 y.o. female presents for evaluation of headache.  Patient reports a history of migraines.  She states over the past 2 weeks she has had a waxing and waning migrating headache that she describes as a throbbing headache.  She currently rates the headache as an 8 out of 10 and denies that it is the worst headache of her life.  She does induce photosensitivity, nausea.  Denies any syncope, vomiting, dizziness.  She does take Topamax daily for migraines, currently no rescue medications.  She has been taking Tylenol, ibuprofen, cold And her Topamax without resolution but does state it helps.  States she has an appoint with neurologist coming up to discuss different treatment options.  In addition she reports a rash under her left axilla that she noticed today after getting out of her aqua therapy.  Says she has a similar rash underneath both breast that she has been trying to treat with nystatin.  States it is a similar type rash and is pruritic.  No swelling or drainage or itching.  No other concerns at this time.  HPI  Past Medical History:  Diagnosis Date   Anxiety    Asthma    CAP    COPD (chronic obstructive pulmonary disease) (HCC)    CRPS (complex regional pain syndrome type I)    Dysrhythmia    heart rate irregularities; Loop recorder 12/20/19   Ectopic pregnancy    Fibromyalgia    H/O multiple allergies    Hypertension    Migraines    O2 drop; needs hyperoxygenation before procedure    PCOS (polycystic ovarian syndrome)    PONV (postoperative nausea and vomiting)    Raynaud disease     Patient Active Problem List   Diagnosis Date Noted   Cervical stenosis of spinal canal 02/01/2023   Diarrhea 07/09/2022   Dysphagia 07/09/2022   Rectal bleeding 07/09/2022   Epigastric abdominal pain 07/09/2022    Past  Surgical History:  Procedure Laterality Date   ANTERIOR CERVICAL DECOMP/DISCECTOMY FUSION N/A 02/01/2023   Procedure: Anterior Cervical Decmpression Fusion  - Cervical five-Cervical six;  Surgeon: Julio Sicks, MD;  Location: [facility] OR;  Service: Neurosurgery;  Laterality: N/A;   BIOPSY  07/09/2022   Procedure: BIOPSY;  Surgeon: Charlott Rakes, MD;  Location: [facility];  Service: Gastroenterology;;   CHOLECYSTECTOMY     COLONOSCOPY WITH PROPOFOL N/A 07/09/2022   Procedure: COLONOSCOPY WITH PROPOFOL;  Surgeon: Charlott Rakes, MD;  Location: [facility];  Service: Gastroenterology;  Laterality: N/A;   ECTOPIC PREGNANCY SURGERY     ESOPHAGOGASTRODUODENOSCOPY (EGD) WITH PROPOFOL N/A 07/09/2022   Procedure: ESOPHAGOGASTRODUODENOSCOPY (EGD) WITH PROPOFOL;  Surgeon: Charlott Rakes, MD;  Location: [facility];  Service: Gastroenterology;  Laterality: N/A;  With possible Dilation   MEDIAL COLLATERAL LIGAMENT REPAIR, KNEE Left    TONSILLECTOMY      OB History     Gravida  3   Para  1   Term      Preterm  1   AB  2   Living  1      SAB  2   IAB      Ectopic      Multiple      Live Births  1  Home Medications    Prior to Admission medications   Medication Sig Start Date End Date Taking? Authorizing Provider  clotrimazole-betamethasone (LOTRISONE) cream Apply to affected area 2 times daily prn 05/26/23  Yes Radford Pax, NP  albuterol (VENTOLIN HFA) 108 (90 Base) MCG/ACT inhaler Inhale 2 puffs into the lungs every 6 (six) hours as needed for wheezing or shortness of breath.    [provider]  amitriptyline (ELAVIL) 10 MG tablet Take 10 mg by mouth at bedtime.    [provider]  clarithromycin (BIAXIN) 500 MG tablet Take 1 tablet (500 mg total) by mouth 2 (two) times daily. 04/30/23   Eustace Moore, MD  desvenlafaxine (PRISTIQ) 100 MG 24 hr tablet Take 100 mg by mouth daily. 12/20/22 12/20/23  [provider]  dicyclomine  (BENTYL) 20 MG tablet Take 1 tablet (20 mg total) by mouth 2 (two) times daily as needed for spasms (abdominal cramping). 05/20/22   Mesner, Barbara Cower, MD  EPINEPHrine 0.3 mg/0.3 mL IJ SOAJ injection Inject 0.3 mg into the muscle as needed for anaphylaxis.    [provider]  etonogestrel (NEXPLANON) 68 MG IMPL implant 68 mg by Subdermal route.    [provider]  gabapentin (NEURONTIN) 300 MG capsule Take 900 mg by mouth 3 (three) times daily. 05/13/22   [provider]  HYDROcodone-acetaminophen (NORCO) 10-325 MG tablet Take 1 tablet by mouth every 6 (six) hours. 02/01/23   Julio Sicks, MD  hydrOXYzine (VISTARIL) 50 MG capsule Take 50 mg by mouth in the morning and at bedtime. 06/14/22   [provider]  ibuprofen (ADVIL) 800 MG tablet Take 800 mg by mouth 3 (three) times daily as needed for painful procedure/intervention. 04/08/22   [provider]  ipratropium-albuterol (DUONEB) 0.5-2.5 (3) MG/3ML SOLN Take 3 mLs by nebulization daily as needed (shortness of breath).    [provider]  labetalol (NORMODYNE) 200 MG tablet Take 200 mg by mouth 2 (two) times daily. 06/15/22   [provider]  LORazepam (ATIVAN) 0.5 MG tablet Take 0.5 mg by mouth daily as needed for anxiety. 12/27/22   [provider]  montelukast (SINGULAIR) 10 MG tablet Take 10 mg by mouth at bedtime.    [provider]  norethindrone-ethinyl estradiol (LOESTRIN) 1-20 MG-MCG tablet Take 1 tablet by mouth daily. 01/21/23   [provider]  omeprazole (PRILOSEC) 40 MG capsule Take 40 mg by mouth daily with breakfast.    [provider]  ondansetron (ZOFRAN) 4 MG/5ML solution Take 8 mg by mouth 2 (two) times daily as needed for nausea/vomiting. 06/15/22   [provider]  predniSONE (STERAPRED UNI-PAK 21 TAB) 10 MG (21) TBPK tablet Take by mouth daily. Take 6 tabs by mouth daily  for 2 days, then 5 tabs for 2 days, then 4 tabs for 2 days, then  3 tabs for 2 days, 2 tabs for 2 days, then 1 tab by mouth daily for 2 days 04/30/23   Eustace Moore, MD  promethazine-dextromethorphan (PROMETHAZINE-DM) 6.25-15 MG/5ML syrup Take 5 mLs by mouth 4 (four) times daily as needed for cough. 04/30/23   Eustace Moore, MD  tiZANidine (ZANAFLEX) 4 MG tablet Take 1 tablet (4 mg total) by mouth every 8 (eight) hours as needed for muscle spasms. 02/01/23   Julio Sicks, MD  topiramate (TOPAMAX) 50 MG tablet Take 100 mg by mouth at bedtime. 05/21/22   [provider]    Family History History reviewed. No pertinent family history.  Social History Social History   Tobacco Use   Smoking status: Former    Types: Cigarettes   Smokeless tobacco: Never  Vaping Use   Vaping status: Never Used  Substance Use Topics   Alcohol use: No   Drug use: No     Allergies   Capsicum, Cefdinir, Levofloxacin, Penicillins, Wound dressing adhesive, Keppra [levetiracetam], Oxybutynin, Relafen [nabumetone], Tape, Dilaudid [hydromorphone], Baclofen, Bromelains, Buspirone, Doxycycline, Nifedipine, Pineapple, Robaxin [methocarbamol], and Venlafaxine   Review of Systems Review of Systems  Skin:  Positive for rash.  Neurological:  Positive for headaches.     Physical Exam Triage Vital Signs ED Triage Vitals  Encounter Vitals Group     BP 05/26/23 1415 (!) 160/90     Systolic BP Percentile --      Diastolic BP Percentile --      Pulse Rate 05/26/23 1415 (!) 108     Resp 05/26/23 1415 16     Temp 05/26/23 1415 98.8 F (37.1 C)     Temp Source 05/26/23 1415 Temporal     SpO2 05/26/23 1415 95 %     Weight --      Height --      Head Circumference --      Peak Flow --      Pain Score 05/26/23 1422 9     Pain Loc --      Pain Education --      Exclude from Growth Chart --    No data found.  Updated Vital Signs BP (!) 160/90 (BP Location: Left Arm)   Pulse (!) 108   Temp 98.8 F (37.1 C) (Temporal)   Resp 16   SpO2 95%   Visual  Acuity Right Eye Distance:   Left Eye Distance:   Bilateral Distance:    Right Eye Near:   Left Eye Near:    Bilateral Near:     Physical Exam Vitals and nursing note reviewed.  Constitutional:      General: She is not in acute distress.    Appearance: Normal appearance. She is not ill-appearing.     Comments: Patient is in wheelchair  HENT:     Head: Normocephalic and atraumatic.  Eyes:     Extraocular Movements: Extraocular movements intact.     Conjunctiva/sclera: Conjunctivae normal.     Pupils: Pupils are equal, round, and reactive to light.  Cardiovascular:     Rate and Rhythm: Normal rate.  Pulmonary:     Effort: Pulmonary effort is normal.  Skin:    General: Skin is warm and dry.     Findings: Rash present. Rash is macular.     Comments: There is a erythematic macular rash underneath the left axilla.  Slight scaling without drainage, swelling.  No warmth.  Neurological:     General: No focal deficit present.     Mental Status: She is alert and oriented to person, place, and time.     GCS: GCS eye subscore is 4. GCS verbal subscore is 5. GCS motor subscore is 6.     Cranial Nerves: No facial asymmetry.     Comments: Patient baseline weakness on the right, strength is 5 out of 5 on left  Psychiatric:        Mood and Affect: Mood normal.        Behavior: Behavior normal.      UC Treatments / Results  Labs (all labs ordered are listed, but only abnormal results are displayed) Labs Reviewed - No  data to display  EKG   Radiology No results found.  Procedures Procedures (including critical care time)  Medications Ordered in UC Medications  ketorolac (TORADOL) 30 MG/ML injection 30 mg (30 mg Intramuscular Given 05/26/23 1447)  dexamethasone (DECADRON) injection 6 mg (6 mg Intramuscular Given 05/26/23 1447)    Initial Impression / Assessment and Plan / UC Course  I have reviewed the triage vital signs and the nursing notes.  Pertinent labs & imaging  results that were available during my care of the patient were reviewed by me and considered in my medical decision making (see chart for details).     Reviewed exam and symptoms with patient.  No red flags.  Patient given Toradol and Decadron injection in clinic.  Monitored for 10 minutes after injection with no reaction noted and tolerated well.  Was instructed no NSAIDs for 24 hours and verbalized understanding.  Advise follow-up with neurologist for additional treatment options and to continue her Topamax as prescribed.  Discussed rash under arm consistent with intertrigo.  She is already tried nystatin powder will do Lotrisone cream.  Advise follow-up with PCP in 2 days for recheck.  Strict ER precautions reviewed and patient verbalized understanding. Final Clinical Impressions(s) / UC Diagnoses   Final diagnoses:  Acute intractable headache, unspecified headache type  Intertrigo     Discharge Instructions      You were given a Toradol injection in clinic today. Do not take any over the counter NSAID's such as Advil, ibuprofen, Aleve, or naproxen for 24 hours.  You may take tylenol if needed Start Lotrisone cream to the rash under your arm.  Please keep the area clean and dry as possible.  Follow-up with your PCP in 2 days for recheck.  Also follow-up with your neurologist at your scheduled appointment.  Please go to the emergency room if you develop any worsening symptoms.  I hope you feel better soon!      ED Prescriptions     Medication Sig Dispense Auth. Provider   clotrimazole-betamethasone (LOTRISONE) cream Apply to affected area 2 times daily prn 45 g Radford Pax, NP      PDMP not reviewed this encounter.   Radford Pax, NP 05/26/23 [phone]

## 2023-07-11 ENCOUNTER — Inpatient Hospital Stay: Admission: RE | Admit: 2023-07-11 | Payer: BC Managed Care – PPO | Source: Ambulatory Visit

## 2023-07-13 ENCOUNTER — Other Ambulatory Visit: Payer: Self-pay | Admitting: Neurosurgery

## 2023-07-13 DIAGNOSIS — M5414 Radiculopathy, thoracic region: Secondary | ICD-10-CM

## 2023-08-01 ENCOUNTER — Encounter: Payer: Self-pay | Admitting: Neurosurgery

## 2023-08-06 ENCOUNTER — Ambulatory Visit
Admission: RE | Admit: 2023-08-06 | Discharge: 2023-08-06 | Disposition: A | Discharge status: Home / Self Care | Payer: BC Managed Care – PPO | Source: Ambulatory Visit | Attending: Neurosurgery | Admitting: Neurosurgery

## 2023-08-06 DIAGNOSIS — M5414 Radiculopathy, thoracic region: Secondary | ICD-10-CM

## 2023-08-28 ENCOUNTER — Ambulatory Visit
Admission: EM | Admit: 2023-08-28 | Discharge: 2023-08-28 | Disposition: A | Discharge status: Home / Self Care | Payer: BC Managed Care – PPO | Attending: Family Medicine | Admitting: Family Medicine

## 2023-08-28 ENCOUNTER — Other Ambulatory Visit: Payer: Self-pay

## 2023-08-28 ENCOUNTER — Encounter: Payer: Self-pay | Admitting: Emergency Medicine

## 2023-08-28 DIAGNOSIS — R0981 Nasal congestion: Secondary | ICD-10-CM

## 2023-08-28 DIAGNOSIS — H109 Unspecified conjunctivitis: Secondary | ICD-10-CM

## 2023-08-28 DIAGNOSIS — J0100 Acute maxillary sinusitis, unspecified: Secondary | ICD-10-CM | POA: Diagnosis not present

## 2023-08-28 MED ORDER — PREDNISONE 10 MG (21) PO TBPK: ORAL_TABLET | Freq: Every day | ORAL | 0 refills | Status: AC

## 2023-08-28 MED ORDER — SULFACETAMIDE SODIUM 10 % OP SOLN: 1.0000 [drp] | Freq: Three times a day (TID) | OPHTHALMIC | 0 refills | Status: AC

## 2023-08-28 MED ORDER — SULFAMETHOXAZOLE-TRIMETHOPRIM 800-160 MG PO TABS: 1.0000 | ORAL_TABLET | Freq: Two times a day (BID) | ORAL | 0 refills | Status: AC

## 2023-08-28 NOTE — Discharge Instructions (Addendum)
Advised patient to take medications as directed with food to completion.  Advised patient to take prednisone with first dose of Bactrim until complete.  Advised patient to instill eyedrops as directed.  Encouraged increase daily water intake to 64 ounces per day while taking these medications.  Advised if symptoms worsen and/or unresolved please follow-up PCP or here for further evaluation.  Advised if symptoms worsen and/or unresolved please follow-up with PCP, local optometry/ophthalmology or here for further evaluation.

## 2023-08-28 NOTE — ED Triage Notes (Signed)
Patient presents to Urgent Care with complaints of cough-green sputum,watery eyes, shortness breath, chest tightness since 4 days ago. Patient reports symptoms started quickly. Daughter was sick as well.Stated it was virus. Taking Tylenol cold and flu, nebulizer, saline rinses.

## 2023-08-28 NOTE — ED Provider Notes (Signed)
[facility]    CSN: [id number] Arrival date & time: 08/28/23  1134      History   Chief Complaint Chief Complaint  Patient presents with   Cough   Nasal Congestion    HPI Joanna Zimmerman is a 38 y.o. female.   HPI Pleasant 37 year old female presents with cough with green sputum watery eyes shortness of breath and chest tightness for 4 days.  Patient reports that her daughter is sick as well.  Patient is accompanied by her significant other who will also be evaluated today.PMH significant for CRPS, morbid obesity, and COPD.  Past Medical History:  Diagnosis Date   Anxiety    Asthma    CAP    COPD (chronic obstructive pulmonary disease) (HCC)    CRPS (complex regional pain syndrome type I)    Dysrhythmia    heart rate irregularities; Loop recorder 12/20/19   Ectopic pregnancy    Fibromyalgia    H/O multiple allergies    Hypertension    Migraines    O2 drop; needs hyperoxygenation before procedure    PCOS (polycystic ovarian syndrome)    PONV (postoperative nausea and vomiting)    Raynaud disease     Patient Active Problem List   Diagnosis Date Noted   Cervical stenosis of spinal canal 02/01/2023   Diarrhea 07/09/2022   Dysphagia 07/09/2022   Rectal bleeding 07/09/2022   Epigastric abdominal pain 07/09/2022    Past Surgical History:  Procedure Laterality Date   ANTERIOR CERVICAL DECOMP/DISCECTOMY FUSION N/A 02/01/2023   Procedure: Anterior Cervical Decmpression Fusion  - Cervical five-Cervical six;  Surgeon: Julio Sicks, MD;  Location: [facility] OR;  Service: Neurosurgery;  Laterality: N/A;   BIOPSY  07/09/2022   Procedure: BIOPSY;  Surgeon: Charlott Rakes, MD;  Location: [facility];  Service: Gastroenterology;;   CHOLECYSTECTOMY     COLONOSCOPY WITH PROPOFOL N/A 07/09/2022   Procedure: COLONOSCOPY WITH PROPOFOL;  Surgeon: Charlott Rakes, MD;  Location: [facility];  Service: Gastroenterology;  Laterality: N/A;   ECTOPIC PREGNANCY SURGERY      ESOPHAGOGASTRODUODENOSCOPY (EGD) WITH PROPOFOL N/A 07/09/2022   Procedure: ESOPHAGOGASTRODUODENOSCOPY (EGD) WITH PROPOFOL;  Surgeon: Charlott Rakes, MD;  Location: [facility];  Service: Gastroenterology;  Laterality: N/A;  With possible Dilation   MEDIAL COLLATERAL LIGAMENT REPAIR, KNEE Left    TONSILLECTOMY      OB History     Gravida  3   Para  1   Term      Preterm  1   AB  2   Living  1      SAB  2   IAB      Ectopic      Multiple      Live Births  1            Home Medications    Prior to Admission medications   Medication Sig Start Date End Date Taking? Authorizing Provider  albuterol (VENTOLIN HFA) 108 (90 Base) MCG/ACT inhaler Inhale 2 puffs into the lungs every 6 (six) hours as needed for wheezing or shortness of breath.   Yes [provider]  amitriptyline (ELAVIL) 10 MG tablet Take 10 mg by mouth at bedtime.   Yes [provider]  clotrimazole-betamethasone (LOTRISONE) cream Apply to affected area 2 times daily prn 05/26/23  Yes Radford Pax, NP  desvenlafaxine (PRISTIQ) 100 MG 24 hr tablet Take 100 mg by mouth daily. 12/20/22 12/20/23 Yes [provider]  dicyclomine (BENTYL) 20 MG tablet Take 1 tablet (  20 mg total) by mouth 2 (two) times daily as needed for spasms (abdominal cramping). 05/20/22  Yes Mesner, Barbara Cower, MD  EPINEPHrine 0.3 mg/0.3 mL IJ SOAJ injection Inject 0.3 mg into the muscle as needed for anaphylaxis.   Yes [provider]  etonogestrel (NEXPLANON) 68 MG IMPL implant 68 mg by Subdermal route.   Yes [provider]  gabapentin (NEURONTIN) 300 MG capsule Take 900 mg by mouth 3 (three) times daily. 05/13/22  Yes [provider]  HYDROcodone-acetaminophen (NORCO) 10-325 MG tablet Take 1 tablet by mouth every 6 (six) hours. 02/01/23  Yes Pool, Sherilyn Cooter, MD  hydrOXYzine (VISTARIL) 50 MG capsule Take 50 mg by mouth in the morning and at bedtime. 06/14/22  Yes [provider]  ibuprofen  (ADVIL) 800 MG tablet Take 800 mg by mouth 3 (three) times daily as needed for painful procedure/intervention. 04/08/22  Yes [provider]  ipratropium-albuterol (DUONEB) 0.5-2.5 (3) MG/3ML SOLN Take 3 mLs by nebulization daily as needed (shortness of breath).   Yes [provider]  labetalol (NORMODYNE) 200 MG tablet Take 200 mg by mouth 2 (two) times daily. 06/15/22  Yes [provider]  LORazepam (ATIVAN) 0.5 MG tablet Take 0.5 mg by mouth daily as needed for anxiety. 12/27/22  Yes [provider]  montelukast (SINGULAIR) 10 MG tablet Take 10 mg by mouth at bedtime.   Yes [provider]  norethindrone-ethinyl estradiol (LOESTRIN) 1-20 MG-MCG tablet Take 1 tablet by mouth daily. 01/21/23  Yes [provider]  omeprazole (PRILOSEC) 40 MG capsule Take 40 mg by mouth daily with breakfast.   Yes [provider]  ondansetron (ZOFRAN) 4 MG/5ML solution Take 8 mg by mouth 2 (two) times daily as needed for nausea/vomiting. 06/15/22  Yes [provider]  predniSONE (STERAPRED UNI-PAK 21 TAB) 10 MG (21) TBPK tablet Take by mouth daily. Take 6 tabs by mouth daily  for 2 days, then 5 tabs for 2 days, then 4 tabs for 2 days, then 3 tabs for 2 days, 2 tabs for 2 days, then 1 tab by mouth daily for 2 days 08/28/23  Yes Trevor Iha, FNP  sulfacetamide (BLEPH-10) 10 % ophthalmic solution Place 1-2 drops into both eyes in the morning, at noon, and at bedtime. 08/28/23  Yes Trevor Iha, FNP  sulfamethoxazole-trimethoprim (BACTRIM DS) 800-160 MG tablet Take 1 tablet by mouth 2 (two) times daily for 14 days. 08/28/23 09/11/23 Yes Trevor Iha, FNP  tiZANidine (ZANAFLEX) 4 MG tablet Take 1 tablet (4 mg total) by mouth every 8 (eight) hours as needed for muscle spasms. 02/01/23  Yes Pool, Sherilyn Cooter, MD  topiramate (TOPAMAX) 50 MG tablet Take 100 mg by mouth at bedtime. 05/21/22  Yes [provider]    Family History History reviewed. No  pertinent family history.  Social History Social History   Tobacco Use   Smoking status: Former    Types: Cigarettes   Smokeless tobacco: Never  Vaping Use   Vaping status: Never Used  Substance Use Topics   Alcohol use: No   Drug use: No     Allergies   Capsicum, Cefdinir, Levofloxacin, Penicillins, Wound dressing adhesive, Keppra [levetiracetam], Oxybutynin, Relafen [nabumetone], Tape, Dilaudid [hydromorphone], Baclofen, Bromelains, Buspirone, Doxycycline, Nifedipine, Pineapple, Robaxin [methocarbamol], and Venlafaxine   Review of Systems Review of Systems  HENT:  Positive for congestion and postnasal drip.   Eyes:  Positive for discharge.  Respiratory:  Positive for cough.   All other systems reviewed and are negative.  Physical Exam Triage Vital Signs ED Triage Vitals  Encounter Vitals Group     BP 08/28/23 1159 107/74     Systolic BP Percentile --      Diastolic BP Percentile --      Pulse Rate 08/28/23 1159 78     Resp 08/28/23 1159 20     Temp 08/28/23 1159 98.3 F (36.8 C)     Temp Source 08/28/23 1159 Oral     SpO2 08/28/23 1159 98 %     Weight --      Height --      Head Circumference --      Peak Flow --      Pain Score 08/28/23 1158 4     Pain Loc --      Pain Education --      Exclude from Growth Chart --    No data found.  Updated Vital Signs BP 107/74 (BP Location: Left Arm)   Pulse 78   Temp 98.3 F (36.8 C) (Oral)   Resp 20   SpO2 98%    Physical Exam Vitals and nursing note reviewed.  Constitutional:      Appearance: Normal appearance. She is obese. She is ill-appearing.  HENT:     Head: Normocephalic and atraumatic.     Right Ear: Tympanic membrane and external ear normal.     Left Ear: Tympanic membrane and external ear normal.     Ears:     Comments: Significant eustachian tube dysfunction noted bilaterally    Nose:     Right Sinus: Maxillary sinus tenderness present.     Left Sinus: Maxillary sinus tenderness present.      Comments: Turbinates are erythematous/edematous    Mouth/Throat:     Mouth: Mucous membranes are moist.     Pharynx: Oropharynx is clear.     Comments: Mild to moderate amount of clear drainage of posterior oropharynx noted Eyes:     Extraocular Movements: Extraocular movements intact.     Conjunctiva/sclera: Conjunctivae normal.     Pupils: Pupils are equal, round, and reactive to light.     Comments: Eyes: Bilateral sclera with +1 injection, patient reports bilateral eyes are itchy and watery  Cardiovascular:     Rate and Rhythm: Normal rate and regular rhythm.     Pulses: Normal pulses.     Heart sounds: Normal heart sounds.  Pulmonary:     Effort: Pulmonary effort is normal.     Breath sounds: Normal breath sounds. No wheezing, rhonchi or rales.  Musculoskeletal:        General: Normal range of motion.     Cervical back: Normal range of motion and neck supple.  Skin:    General: Skin is warm and dry.  Neurological:     General: No focal deficit present.     Mental Status: She is alert and oriented to person, place, and time. Mental status is at baseline.     Comments: Patient seated comfortably in wheelchair this afternoon during exam  Psychiatric:        Mood and Affect: Mood normal.        Behavior: Behavior normal.      UC Treatments / Results  Labs (all labs ordered are listed, but only abnormal results are displayed) Labs Reviewed - No data to display  EKG   Radiology No results found.  Procedures Procedures (including critical care time)  Medications Ordered in UC Medications - No data to display  Initial Impression /  Assessment and Plan / UC Course  I have reviewed the triage vital signs and the nursing notes.  Pertinent labs & imaging results that were available during my care of the patient were reviewed by me and considered in my medical decision making (see chart for details).     MDM: 1.  Acute maxillary sinusitis, recurrence not  specified-Rx'd Bactrim 800/160 mg tablet: Take 1 tablet twice daily x 14 days; 2.  Congestion of nasal sinus-Rx'd Sterapred Unipak (tapering from 60 mg to 10 mg over 10 days); 3.  Conjunctivitis of both eyes, unspecified conjunctivitis type-Rx'd sulfacetamide 10% ophthalmic solution: Place 1 to 2 drops in both eyes 3 times daily for the next 7 days. Advised patient to take medications as directed with food to completion.  Advised patient to take prednisone with first dose of Bactrim until complete.  Advised patient to instill eyedrops as directed.  Encouraged increase daily water intake to 64 ounces per day while taking these medications.  Advised if symptoms worsen and/or unresolved please follow-up PCP or here for further evaluation.  Advised if symptoms worsen and/or unresolved please follow-up with PCP, local optometry/ophthalmology or here for further evaluation.  Patient discharged home, hemodynamically stable. Final Clinical Impressions(s) / UC Diagnoses   Final diagnoses:  Acute maxillary sinusitis, recurrence not specified  Congestion of nasal sinus  Conjunctivitis of both eyes, unspecified conjunctivitis type     Discharge Instructions      Advised patient to take medications as directed with food to completion.  Advised patient to take prednisone with first dose of Bactrim until complete.  Advised patient to instill eyedrops as directed.  Encouraged increase daily water intake to 64 ounces per day while taking these medications.  Advised if symptoms worsen and/or unresolved please follow-up PCP or here for further evaluation.  Advised if symptoms worsen and/or unresolved please follow-up with PCP, local optometry/ophthalmology or here for further evaluation.     ED Prescriptions     Medication Sig Dispense Auth. Provider   sulfamethoxazole-trimethoprim (BACTRIM DS) 800-160 MG tablet Take 1 tablet by mouth 2 (two) times daily for 14 days. 28 tablet Trevor Iha, FNP   predniSONE  (STERAPRED UNI-PAK 21 TAB) 10 MG (21) TBPK tablet Take by mouth daily. Take 6 tabs by mouth daily  for 2 days, then 5 tabs for 2 days, then 4 tabs for 2 days, then 3 tabs for 2 days, 2 tabs for 2 days, then 1 tab by mouth daily for 2 days 42 tablet Trevor Iha, FNP   sulfacetamide (BLEPH-10) 10 % ophthalmic solution Place 1-2 drops into both eyes in the morning, at noon, and at bedtime. 5 mL Trevor Iha, FNP      PDMP not reviewed this encounter.   Trevor Iha, FNP 08/28/23 [phone]

## 2023-08-30 ENCOUNTER — Telehealth: Payer: Self-pay | Admitting: Family Medicine

## 2023-08-30 ENCOUNTER — Telehealth: Payer: Self-pay | Admitting: Emergency Medicine

## 2023-08-30 ENCOUNTER — Encounter: Payer: Self-pay | Admitting: Family Medicine

## 2023-08-30 MED ORDER — PROMETHAZINE-DM 6.25-15 MG/5ML PO SYRP: 5.0000 mL | ORAL_SOLUTION | Freq: Four times a day (QID) | ORAL | 0 refills | Status: AC | PRN

## 2023-08-30 NOTE — Telephone Encounter (Signed)
Spoke with patient and she states that she is feeling much worse than when she was here.  Patient is currently taken the antibiotics and steroids as prescribed.  The cough kept her up till 4am this morning and causing patient to have muscle spasms.  Patient is currently not taken any cough syrups.  [place] [facility].  Please advise.

## 2023-08-30 NOTE — Telephone Encounter (Signed)
Received a call from patient's friend regarding patient's visit on Sunday.  Patient was prescribed an antibiotic and steroids, her cough is "violently worse".  Patient does have cough syrup which isn't helping.  The cough has aggravated patient's "CRPS".  They were instructed by Trevor Iha if patient does not improve to give [place] a call and we will send in a prescription.  Please advise.  [phone].

## 2023-08-30 NOTE — Telephone Encounter (Signed)
Patient is called requesting cough medicine.  She has multiple allergies.  Will send Phenergan DM to her pharmacy

## 2023-09-09 ENCOUNTER — Ambulatory Visit
Admission: EM | Admit: 2023-09-09 | Discharge: 2023-09-09 | Discharge status: Against Medical Advice | Payer: BC Managed Care – PPO

## 2023-09-10 ENCOUNTER — Encounter (HOSPITAL_BASED_OUTPATIENT_CLINIC_OR_DEPARTMENT_OTHER): Payer: Self-pay | Admitting: Emergency Medicine

## 2023-09-10 ENCOUNTER — Other Ambulatory Visit: Payer: Self-pay

## 2023-09-10 ENCOUNTER — Ambulatory Visit: Payer: BC Managed Care – PPO

## 2023-09-10 ENCOUNTER — Emergency Department (HOSPITAL_BASED_OUTPATIENT_CLINIC_OR_DEPARTMENT_OTHER)
Admission: EM | Admit: 2023-09-10 | Discharge: 2023-09-10 | Disposition: A | Discharge status: Home / Self Care | Payer: BC Managed Care – PPO | Attending: Emergency Medicine | Admitting: Emergency Medicine

## 2023-09-10 DIAGNOSIS — J449 Chronic obstructive pulmonary disease, unspecified: Secondary | ICD-10-CM | POA: Diagnosis not present

## 2023-09-10 DIAGNOSIS — R6889 Other general symptoms and signs: Secondary | ICD-10-CM

## 2023-09-10 DIAGNOSIS — J09X2 Influenza due to identified novel influenza A virus with other respiratory manifestations: Secondary | ICD-10-CM | POA: Insufficient documentation

## 2023-09-10 DIAGNOSIS — Z7951 Long term (current) use of inhaled steroids: Secondary | ICD-10-CM | POA: Diagnosis not present

## 2023-09-10 DIAGNOSIS — G894 Chronic pain syndrome: Secondary | ICD-10-CM | POA: Diagnosis not present

## 2023-09-10 DIAGNOSIS — J069 Acute upper respiratory infection, unspecified: Secondary | ICD-10-CM | POA: Diagnosis not present

## 2023-09-10 MED ORDER — OSELTAMIVIR PHOSPHATE 75 MG PO CAPS: 75.0000 mg | ORAL_CAPSULE | Freq: Two times a day (BID) | ORAL | 0 refills | Status: AC

## 2023-09-10 MED ORDER — HYDROCODONE-ACETAMINOPHEN 5-325 MG PO TABS
2.0000 | ORAL_TABLET | Freq: Once | ORAL | Status: AC
  Administered 2023-09-10: 2 via ORAL
  Filled 2023-09-10: qty 2

## 2023-09-10 NOTE — ED Provider Notes (Signed)
[place] EMERGENCY DEPARTMENT AT [facility]  Provider Note  CSN: [id number] Arrival date & time: 09/10/23 0549  History Chief Complaint  Patient presents with   URI    Joanna Zimmerman is a 37 y.o. female with history of COPD, CRPS here for 2 days of flu like symptoms. Daughter tested positive for influenza. Patient had cough earlier in the month that had improved prior to getting worse. She is still on steroids from previous illness. Has been using nebs. She reports she is out of pain medication for her CRPS.    Home Medications Prior to Admission medications   Medication Sig Start Date End Date Taking? Authorizing Provider  oseltamivir (TAMIFLU) 75 MG capsule Take 1 capsule (75 mg total) by mouth every 12 (twelve) hours. 09/10/23  Yes Pollyann Savoy, MD  albuterol (VENTOLIN HFA) 108 (90 Base) MCG/ACT inhaler Inhale 2 puffs into the lungs every 6 (six) hours as needed for wheezing or shortness of breath.    [provider]  amitriptyline (ELAVIL) 10 MG tablet Take 10 mg by mouth at bedtime.    [provider]  clotrimazole-betamethasone (LOTRISONE) cream Apply to affected area 2 times daily prn 05/26/23   Radford Pax, NP  desvenlafaxine (PRISTIQ) 100 MG 24 hr tablet Take 100 mg by mouth daily. 12/20/22 12/20/23  [provider]  dicyclomine (BENTYL) 20 MG tablet Take 1 tablet (20 mg total) by mouth 2 (two) times daily as needed for spasms (abdominal cramping). 05/20/22   Mesner, Barbara Cower, MD  EPINEPHrine 0.3 mg/0.3 mL IJ SOAJ injection Inject 0.3 mg into the muscle as needed for anaphylaxis.    [provider]  etonogestrel (NEXPLANON) 68 MG IMPL implant 68 mg by Subdermal route.    [provider]  gabapentin (NEURONTIN) 300 MG capsule Take 900 mg by mouth 3 (three) times daily. 05/13/22   [provider]  HYDROcodone-acetaminophen (NORCO) 10-325 MG tablet Take 1 tablet by mouth every 6 (six) hours. 02/01/23   Julio Sicks,  MD  hydrOXYzine (VISTARIL) 50 MG capsule Take 50 mg by mouth in the morning and at bedtime. 06/14/22   [provider]  ibuprofen (ADVIL) 800 MG tablet Take 800 mg by mouth 3 (three) times daily as needed for painful procedure/intervention. 04/08/22   [provider]  ipratropium-albuterol (DUONEB) 0.5-2.5 (3) MG/3ML SOLN Take 3 mLs by nebulization daily as needed (shortness of breath).    [provider]  labetalol (NORMODYNE) 200 MG tablet Take 200 mg by mouth 2 (two) times daily. 06/15/22   [provider]  LORazepam (ATIVAN) 0.5 MG tablet Take 0.5 mg by mouth daily as needed for anxiety. 12/27/22   [provider]  montelukast (SINGULAIR) 10 MG tablet Take 10 mg by mouth at bedtime.    [provider]  norethindrone-ethinyl estradiol (LOESTRIN) 1-20 MG-MCG tablet Take 1 tablet by mouth daily. 01/21/23   [provider]  omeprazole (PRILOSEC) 40 MG capsule Take 40 mg by mouth daily with breakfast.    [provider]  ondansetron (ZOFRAN) 4 MG/5ML solution Take 8 mg by mouth 2 (two) times daily as needed for nausea/vomiting. 06/15/22   [provider]  predniSONE (STERAPRED UNI-PAK 21 TAB) 10 MG (21) TBPK tablet Take by mouth daily. Take 6 tabs by mouth daily  for 2 days, then 5 tabs for 2 days, then 4 tabs for 2 days, then 3 tabs for 2 days, 2 tabs for 2 days, then 1 tab by mouth  daily for 2 days 08/28/23   Trevor Iha, FNP  promethazine-dextromethorphan (PROMETHAZINE-DM) 6.25-15 MG/5ML syrup Take 5 mLs by mouth 4 (four) times daily as needed for cough. 08/30/23   Eustace Moore, MD  sulfacetamide (BLEPH-10) 10 % ophthalmic solution Place 1-2 drops into both eyes in the morning, at noon, and at bedtime. 08/28/23   Trevor Iha, FNP  sulfamethoxazole-trimethoprim (BACTRIM DS) 800-160 MG tablet Take 1 tablet by mouth 2 (two) times daily for 14 days. 08/28/23 09/11/23  Trevor Iha, FNP  tiZANidine (ZANAFLEX) 4 MG  tablet Take 1 tablet (4 mg total) by mouth every 8 (eight) hours as needed for muscle spasms. 02/01/23   Julio Sicks, MD  topiramate (TOPAMAX) 50 MG tablet Take 100 mg by mouth at bedtime. 05/21/22   [provider]     Allergies    Capsicum, Cefdinir, Levofloxacin, Penicillins, Keppra [levetiracetam], Oxybutynin, Relafen [nabumetone], Tape, Wound dressing adhesive, Baclofen, Bromelains, Buspirone, Dilaudid [hydromorphone], Doxycycline, Nifedipine, Pineapple, Robaxin [methocarbamol], and Venlafaxine   Review of Systems   Review of Systems Please see HPI for pertinent positives and negatives  Physical Exam BP (!) 143/113 (BP Location: Left Arm)   Pulse (!) 106   Temp 99.3 F (37.4 C) (Oral)   Resp (!) 22   Ht 5\' 7"  (1.702 m)   Wt 136.1 kg   SpO2 97%   BMI 46.99 kg/m   Physical Exam Vitals and nursing note reviewed.  Constitutional:      Appearance: Normal appearance.  HENT:     Head: Normocephalic and atraumatic.     Nose: Nose normal.     Mouth/Throat:     Mouth: Mucous membranes are moist.  Eyes:     Extraocular Movements: Extraocular movements intact.     Conjunctiva/sclera: Conjunctivae normal.  Cardiovascular:     Rate and Rhythm: Normal rate.  Pulmonary:     Effort: Pulmonary effort is normal.     Breath sounds: Normal breath sounds. No wheezing.  Abdominal:     General: Abdomen is flat.     Palpations: Abdomen is soft.     Tenderness: There is no abdominal tenderness.  Musculoskeletal:        General: No swelling. Normal range of motion.     Cervical back: Neck supple.  Skin:    General: Skin is warm and dry.  Neurological:     General: No focal deficit present.     Mental Status: She is alert.  Psychiatric:        Mood and Affect: Mood normal.     ED Results / Procedures / Treatments   EKG None  Procedures Procedures  Medications Ordered in the ED Medications  HYDROcodone-acetaminophen (NORCO/VICODIN) 5-325 MG per tablet 2 tablet (has  no administration in time range)    Initial Impression and Plan  Patient here for flu-like symptoms with known exposure at home. Given her co-morbidities and less than 48hrs of symptoms, will treat empirically for influenza. She is also requesting a refill of her chronic pain medications, I advised that I am not able to do that, but will give her one dose while in the ED. Advised to follow up with her pain management provider for a refill. Otherwise she is well appearing, no distress with reassuring exam and vitals.   ED Course       MDM Rules/Calculators/A&P Medical Decision Making Problems Addressed: Chronic pain syndrome: chronic illness or injury Flu-like symptoms: acute illness or injury  Risk Prescription drug management.  Final Clinical Impression(s) / ED Diagnoses Final diagnoses:  Flu-like symptoms  Chronic pain syndrome    Rx / DC Orders ED Discharge Orders          Ordered    oseltamivir (TAMIFLU) 75 MG capsule  Every 12 hours        09/10/23 0614             Pollyann Savoy, MD 09/10/23 [phone]

## 2023-09-10 NOTE — ED Triage Notes (Signed)
Flu like Sx X 2 days family at home flu +.

## 2023-09-21 ENCOUNTER — Other Ambulatory Visit: Payer: Self-pay

## 2023-09-21 ENCOUNTER — Encounter (HOSPITAL_BASED_OUTPATIENT_CLINIC_OR_DEPARTMENT_OTHER): Payer: Self-pay

## 2023-09-21 ENCOUNTER — Emergency Department (HOSPITAL_BASED_OUTPATIENT_CLINIC_OR_DEPARTMENT_OTHER)
Admission: EM | Admit: 2023-09-21 | Discharge: 2023-09-21 | Disposition: A | Discharge status: Home / Self Care | Payer: BLUE CROSS/BLUE SHIELD | Attending: Emergency Medicine | Admitting: Emergency Medicine

## 2023-09-21 DIAGNOSIS — R112 Nausea with vomiting, unspecified: Secondary | ICD-10-CM | POA: Insufficient documentation

## 2023-09-21 DIAGNOSIS — E860 Dehydration: Secondary | ICD-10-CM

## 2023-09-21 DIAGNOSIS — F1193 Opioid use, unspecified with withdrawal: Secondary | ICD-10-CM

## 2023-09-21 DIAGNOSIS — R109 Unspecified abdominal pain: Secondary | ICD-10-CM | POA: Insufficient documentation

## 2023-09-21 DIAGNOSIS — F1123 Opioid dependence with withdrawal: Secondary | ICD-10-CM | POA: Diagnosis not present

## 2023-09-21 DIAGNOSIS — D72829 Elevated white blood cell count, unspecified: Secondary | ICD-10-CM | POA: Insufficient documentation

## 2023-09-21 DIAGNOSIS — E872 Acidosis, unspecified: Secondary | ICD-10-CM | POA: Diagnosis not present

## 2023-09-21 LAB — COMPREHENSIVE METABOLIC PANEL
ALT: 64 U/L — ABNORMAL HIGH (ref 0–44)
AST: 45 U/L — ABNORMAL HIGH (ref 15–41)
Albumin: 4.6 g/dL (ref 3.5–5.0)
Alkaline Phosphatase: 72 U/L (ref 38–126)
Anion gap: 17 — ABNORMAL HIGH (ref 5–15)
BUN: 11 mg/dL (ref 6–20)
CO2: 16 mmol/L — ABNORMAL LOW (ref 22–32)
Calcium: 10.5 mg/dL — ABNORMAL HIGH (ref 8.9–10.3)
Chloride: 105 mmol/L (ref 98–111)
Creatinine, Ser: 1.04 mg/dL — ABNORMAL HIGH (ref 0.44–1.00)
GFR, Estimated: >60 mL/min (ref >60)
Glucose, Bld: 142 mg/dL — ABNORMAL HIGH (ref 70–99)
Potassium: 3.9 mmol/L (ref 3.5–5.1)
Sodium: 138 mmol/L (ref 135–145)
Total Bilirubin: 0.7 mg/dL (ref 0.0–1.2)
Total Protein: 8.1 g/dL (ref 6.5–8.1)

## 2023-09-21 LAB — CBC WITH DIFFERENTIAL/PLATELET
Abs Immature Granulocytes: 0.04 10*3/uL (ref 0.00–0.07)
Basophils Absolute: 0.1 10*3/uL (ref 0.0–0.1)
Basophils Relative: 1 %
Eosinophils Absolute: 0.1 10*3/uL (ref 0.0–0.5)
Eosinophils Relative: 1 %
HCT: 46.4 % — ABNORMAL HIGH (ref 36.0–46.0)
Hemoglobin: 15.3 g/dL — ABNORMAL HIGH (ref 12.0–15.0)
Immature Granulocytes: 0 %
Lymphocytes Relative: 18 %
Lymphs Abs: 2.4 10*3/uL (ref 0.7–4.0)
MCH: 26.7 pg (ref 26.0–34.0)
MCHC: 33 g/dL (ref 30.0–36.0)
MCV: 81.1 fL (ref 80.0–100.0)
Monocytes Absolute: 0.9 10*3/uL (ref 0.1–1.0)
Monocytes Relative: 7 %
Neutro Abs: 9.4 10*3/uL — ABNORMAL HIGH (ref 1.7–7.7)
Neutrophils Relative %: 73 %
Platelets: 352 10*3/uL (ref 150–400)
RBC: 5.72 MIL/uL — ABNORMAL HIGH (ref 3.87–5.11)
RDW: 14.9 % (ref 11.5–15.5)
WBC: 13 10*3/uL — ABNORMAL HIGH (ref 4.0–10.5)
nRBC: 0 % (ref 0.0–0.2)

## 2023-09-21 LAB — LIPASE, BLOOD: Lipase: 40 U/L (ref 11–51)

## 2023-09-21 MED ORDER — DROPERIDOL 2.5 MG/ML IJ SOLN
1.2500 mg | Freq: Once | INTRAMUSCULAR | Status: AC
  Administered 2023-09-21: 1.25 mg via INTRAVENOUS
  Filled 2023-09-21: qty 2

## 2023-09-21 MED ORDER — PROCHLORPERAZINE EDISYLATE 10 MG/2ML IJ SOLN
10.0000 mg | Freq: Once | INTRAMUSCULAR | Status: AC
  Administered 2023-09-21: 10 mg via INTRAVENOUS
  Filled 2023-09-21: qty 2

## 2023-09-21 MED ORDER — MORPHINE SULFATE (PF) 4 MG/ML IV SOLN
4.0000 mg | Freq: Once | INTRAVENOUS | Status: AC
  Administered 2023-09-21: 4 mg via INTRAVENOUS
  Filled 2023-09-21: qty 1

## 2023-09-21 MED ORDER — LOPERAMIDE HCL 2 MG PO CAPS
2.0000 mg | ORAL_CAPSULE | Freq: Once | ORAL | Status: AC
  Administered 2023-09-21: 2 mg via ORAL
  Filled 2023-09-21: qty 1

## 2023-09-21 MED ORDER — SODIUM CHLORIDE 0.9 % IV BOLUS
1000.0000 mL | Freq: Once | INTRAVENOUS | Status: AC
  Administered 2023-09-21: 1000 mL via INTRAVENOUS

## 2023-09-21 MED ORDER — FENTANYL CITRATE PF 50 MCG/ML IJ SOSY
50.0000 ug | PREFILLED_SYRINGE | Freq: Once | INTRAMUSCULAR | Status: AC
  Administered 2023-09-21: 50 ug via INTRAVENOUS
  Filled 2023-09-21: qty 1

## 2023-09-21 NOTE — ED Provider Notes (Signed)
 Pt signed out pending symptomatic improvement.    Pt's nausea is much improved.  She is tolerating po fluids.  Pain is still there, but thinks it's from the bed.  HR is still elevated in the 130s, but she said it will be until she can get into a more comfortable bed.  I did offer to admit her, but she wants to go home.  She thinks that once she is more comfortable, she will feel better and does not think that will happen in our beds.  She knows she can come back if needed.   F/u with pcp.   Dean Clarity, MD 09/21/23 [phone]

## 2023-09-21 NOTE — ED Triage Notes (Signed)
 Pt had aquatic therapy at around noon yesterday and since 12:30pm has had pain everywhere. Pt has had diarrhea and vomiting since the end of therapy. Unable to take scheduled pain since 1pm. Pt diaphoretic and reports she feels hot and cold. Pt took 2 Klonopin and anti-emetics and nothing is helping.

## 2023-09-21 NOTE — ED Provider Notes (Signed)
  EMERGENCY DEPARTMENT AT [facility]  Provider Note  CSN: [id number] Arrival date & time: 09/21/23 0430  History Chief Complaint  Patient presents with   Emesis    Joanna Zimmerman is a 38 y.o. female with history of CRPS on scheduled hydrocodone  reports onset of increased abdominal pain and vomiting since her aquatic therapy yesterday afternoon. She has been persistently vomiting since then and unable to keep down her pain or nausea meds at home. She has been diaphoretic.    Home Medications Prior to Admission medications   Medication Sig Start Date End Date Taking? Authorizing Provider  albuterol  (VENTOLIN  HFA) 108 (90 Base) MCG/ACT inhaler Inhale 2 puffs into the lungs every 6 (six) hours as needed for wheezing or shortness of breath.    [provider]  amitriptyline  (ELAVIL ) 10 MG tablet Take 10 mg by mouth at bedtime.    [provider]  clotrimazole -betamethasone  (LOTRISONE ) cream Apply to affected area 2 times daily prn 05/26/23   Mayer, Jodi R, NP  desvenlafaxine (PRISTIQ) 100 MG 24 hr tablet Take 100 mg by mouth daily. 12/20/22 12/20/23  [provider]  dicyclomine  (BENTYL ) 20 MG tablet Take 1 tablet (20 mg total) by mouth 2 (two) times daily as needed for spasms (abdominal cramping). 05/20/22   Mesner, Selinda, MD  EPINEPHrine  0.3 mg/0.3 mL IJ SOAJ injection Inject 0.3 mg into the muscle as needed for anaphylaxis.    [provider]  etonogestrel (NEXPLANON) 68 MG IMPL implant 68 mg by Subdermal route.    [provider]  gabapentin  (NEURONTIN ) 300 MG capsule Take 900 mg by mouth 3 (three) times daily. 05/13/22   [provider]  HYDROcodone -acetaminophen  (NORCO) 10-325 MG tablet Take 1 tablet by mouth every 6 (six) hours. 02/01/23   Louis Shove, MD  hydrOXYzine  (VISTARIL ) 50 MG capsule Take 50 mg by mouth in the morning and at bedtime. 06/14/22   [provider]  ibuprofen  (ADVIL ) 800 MG tablet Take 800  mg by mouth 3 (three) times daily as needed for painful procedure/intervention. 04/08/22   [provider]  ipratropium-albuterol  (DUONEB) 0.5-2.5 (3) MG/3ML SOLN Take 3 mLs by nebulization daily as needed (shortness of breath).    [provider]  labetalol  (NORMODYNE ) 200 MG tablet Take 200 mg by mouth 2 (two) times daily. 06/15/22   [provider]  LORazepam  (ATIVAN ) 0.5 MG tablet Take 0.5 mg by mouth daily as needed for anxiety. 12/27/22   [provider]  montelukast  (SINGULAIR ) 10 MG tablet Take 10 mg by mouth at bedtime.    [provider]  norethindrone -ethinyl estradiol (LOESTRIN) 1-20 MG-MCG tablet Take 1 tablet by mouth daily. 01/21/23   [provider]  omeprazole (PRILOSEC) 40 MG capsule Take 40 mg by mouth daily with breakfast.    [provider]  ondansetron  (ZOFRAN ) 4 MG/5ML solution Take 8 mg by mouth 2 (two) times daily as needed for nausea/vomiting. 06/15/22   [provider]  oseltamivir  (TAMIFLU ) 75 MG capsule Take 1 capsule (75 mg total) by mouth every 12 (twelve) hours. 09/10/23   Roselyn Carlin NOVAK, MD  predniSONE  (STERAPRED UNI-PAK 21 TAB) 10 MG (21) TBPK tablet Take by mouth daily. Take 6 tabs by mouth daily  for 2 days, then 5 tabs for 2 days, then 4 tabs for 2 days, then 3 tabs for 2 days, 2 tabs for 2 days, then 1 tab by mouth daily for 2 days 08/28/23   Teddy Sharper, FNP  promethazine -dextromethorphan (PROMETHAZINE -DM) 6.25-15 MG/5ML syrup Take 5 mLs by mouth 4 (four) times daily as needed for cough. 08/30/23   Maranda Jamee Jacob, MD  sulfacetamide  (BLEPH -10) 10 % ophthalmic solution Place 1-2 drops into both eyes in the morning, at noon, and at bedtime. 08/28/23   Teddy Sharper, FNP  tiZANidine  (ZANAFLEX ) 4 MG tablet Take 1 tablet (4 mg total) by mouth every 8 (eight) hours as needed for muscle spasms. 02/01/23   Louis Shove, MD  topiramate  (TOPAMAX ) 50 MG tablet Take 100 mg by mouth at bedtime. 05/21/22    [provider]     Allergies    Capsicum, Cefdinir, Levofloxacin, Penicillins, Keppra [levetiracetam], Oxybutynin, Relafen [nabumetone], Tape, Wound dressing adhesive, Baclofen, Bromelains, Buspirone, Dilaudid  [hydromorphone ], Doxycycline, Nifedipine, Pineapple, Robaxin [methocarbamol], and Venlafaxine    Review of Systems   Review of Systems Please see HPI for pertinent positives and negatives  Physical Exam BP (!) 155/116 (BP Location: Left Wrist)   Pulse (!) 149   Temp 98 F (36.7 C) (Oral)   Resp 20   Ht 5' 7 (1.702 m)   Wt 136 kg   SpO2 96%   BMI 46.96 kg/m   Physical Exam Vitals and nursing note reviewed.  Constitutional:      Appearance: Normal appearance. She is diaphoretic.  HENT:     Head: Normocephalic and atraumatic.     Nose: Nose normal.     Mouth/Throat:     Mouth: Mucous membranes are moist.  Eyes:     Extraocular Movements: Extraocular movements intact.     Conjunctiva/sclera: Conjunctivae normal.  Cardiovascular:     Rate and Rhythm: Tachycardia present.  Pulmonary:     Effort: Pulmonary effort is normal.     Breath sounds: Normal breath sounds.  Abdominal:     General: Abdomen is flat.     Palpations: Abdomen is soft.     Tenderness: There is abdominal tenderness (mild). There is no guarding.  Musculoskeletal:        General: No swelling. Normal range of motion.     Cervical back: Neck supple.  Skin:    General: Skin is warm.     Comments: piloerection  Neurological:     General: No focal deficit present.     Mental Status: She is alert.  Psychiatric:        Mood and Affect: Mood normal.     ED Results / Procedures / Treatments   EKG EKG Interpretation Date/Time:  Wednesday September 21 2023 06:31:43 EST Ventricular Rate:  145 PR Interval:  91 QRS Duration:  84 QT Interval:  345 QTC Calculation: 536 R Axis:   254  Text Interpretation: Sinus tachycardia Left anterior fascicular block Anterior infarct, old Prolonged QT  interval Since last tracing Rate faster Confirmed by Roselyn Dunnings [phone]) on 09/21/2023 6:35:36 AM  Procedures Procedures  Medications Ordered in the ED Medications  fentaNYL  (SUBLIMAZE ) injection 50 mcg (50 mcg Intravenous Given 09/21/23 0626)  prochlorperazine  (COMPAZINE ) injection 10 mg (10 mg Intravenous Given 09/21/23 0618)  sodium chloride  0.9 % bolus 1,000 mL (1,000 mLs Intravenous New Bag/Given 09/21/23 0618)    Initial Impression and Plan  Patient here with several hours of vomiting and abdominal pain, symptoms at least in part worsened by opioid withdrawal. Will check labs, given pain/nausea meds and IVF for comfort. No indication for imaging at this time.   ED Course   Clinical Course as of 09/21/23 0703  Wed Sep 21, 2023  0617 CBC with mild leukocytosis  and increased H/H, likely hemoconcentration.  [CS]  0631 CMP with mild metabolic acidosis, likely from volume contraction, does not appear to be DKA. Lipase is normal.  [CS]  0700 Care of the patient signed out to the oncoming team at shift change pending re-evaluation after IVF and medications.  [CS]    Clinical Course User Index [CS] Roselyn Carlin NOVAK, MD     MDM Rules/Calculators/A&P Medical Decision Making Problems Addressed: Nausea and vomiting, unspecified vomiting type: acute illness or injury Opioid withdrawal [facility]): acute illness or injury  Amount and/or Complexity of Data Reviewed Labs: ordered.  Risk Prescription drug management.     Final Clinical Impression(s) / ED Diagnoses Final diagnoses:  Nausea and vomiting, unspecified vomiting type  Opioid withdrawal [facility])    Rx / DC Orders ED Discharge Orders     None        Roselyn Carlin NOVAK, MD 09/21/23 [phone]

## 2023-09-21 NOTE — ED Notes (Signed)
 Pt states that her pain is still bad. Thinks it is coming from being in bed. EDP made aware.

## 2024-09-27 NOTE — Progress Notes (Signed)
 " Chief Complaint  Patient presents with   Follow-up    Sick a month ago with sinus issues , put her on antibiotics/ steroids from UC , she feels she been feeling unwell since then , weak , congested , feeling full max of pain she feels really horrible haven't felt this bad in 6 years.     HPI: Joanna Zimmerman is a 39 y.o. female with PCOS, HTN, hypertriglyceridemia, GERD, asthma, anxiety/depression, complex regional pain syndrome  who presents for persistent upper respiratory symptoms. She was initially seen by Dr. Lelon on 1/6 at which time she reported 3 weeks of cough & nasal congestion, migraine.   Per her note: She has a history of severe anaphylaxis to penicillins, and had a negative reaction to respiratory fluoroquinolones previously.  Plan to re-trial a longer (10-day) course of doxycycline along with prednisone .  Refer to ENT for further evaluation.   She messaged back a week later to report persistent cough, migraine, general weakness, and poor appetite.  XR ordered and done yesterday but not yet read by radiology.   Today, she states she had an episode of severe dysuria/vaginal pain on Saturday. Around the same time, she developed increased abdominal pain, loss of appetite, weakness and body aches. She states her cough has improved but she is still producing some greenish mucous and continues to have headaches and sinus pain. She has had some nausea but denies vomiting or diarrhea. She has some ulcerations of her nasal septum for which she has been using mupirocin for >1 month. She states she has an appointment at the end of Feb with ENT.    Past Medical History: Problem List[1]   Surgical History[2]  Medications: Medications Ordered Prior to Encounter[3]  Allergies: Allergies[4]  Family History: Family History[5]  Social History: Social History[6]  Review of Systems: A complete review of systems was obtained and is negative other than what is stated in the  HPI.      Objective:   Physical Exam BP 128/62 (BP Location: Left arm, Patient Position: Sitting)   Pulse 91   Temp 97.6 F (36.4 C) (Temporal)   Ht 1.702 m (5' 7)   LMP 09/21/2024   SpO2 93%   BMI 41.97 kg/m  Gen: Well appearing, well nourished, no acute distress Eyes: conjunctiva non-injected, no scleral icterus, PERRL ENT: normal TMs bilaterally. Boggy nasal turbinates. Ulceration of either side of nasal septum. Posterior pharynx cobblestoned. bilateral sinus tenderness. Neck: supple, no LAD, no thyromegaly, no masses or nodules Pulm: CTAB, no rales, rhonchi or wheezing CV: RRR, s1s2 heard with no rubs, murmurs or gallops, no bruits, No LE edema Abd: +periumbilical & suprapubic abdominal tenderness. Soft, ND, No HSM, normoactive BS     Assessment:     Joanna Zimmerman is a 39 y.o. female who presents to clinic with  Problem List[7]    Plan:     1. Suprapubic pain  Urine Culture   Urine Culture   CANCELED: POC Urinalysis Auto without Microscopic    2. Recurrent sinusitis  moxifloxacin (AVELOX) 400 mg tablet     - UA could not be obtained as patient has been using OTC Azo pain relief but will send for culture. - she will also have previously ordered labs (CBC, CMP, lipase) drawn today - pt with multiple antibiotic allergy/intolerances but suspect she would benefit from change/escalation in therapy. Discussed option to consider Bactrim  vs Clindamycin  vs Avelox and risks & benefits of each. Although she has a prior  intolerance to Levaquin, she elected to try Avelox. She will let me know if she is not able to tolerate this. She will also follow up with ENT as planned.   This plan was dicussed with the patient who expressed understanding.   Return if symptoms worsen or fail to improve.       [1] Patient Active Problem List Diagnosis   Carpal tunnel syndrome of right wrist   Complex regional pain syndrome type 1 of left upper extremity   Depression    Dystonia   Essential hypertension   Neck pain   Gastroesophageal reflux disease without esophagitis   Migraines   History of unilateral salpingectomy   PCOS (polycystic ovarian syndrome)   Other insomnia   Moderate persistent asthma (CMD)   Recurrent major depressive disorder, in partial remission   Positive ANA (antinuclear antibody)   Raynaud's disease   Pain of right lower leg   Seasonal allergic rhinitis due to pollen   Generalized anxiety disorder   Diarrhea   Dysphagia   Endometriosis of left pelvic sidewall   Flank pain   Pilonidal cyst   Positive Lyme disease serology   Syncope   Cramp and spasm   Rectal bleeding   Chronic pain disorder   Cervical stenosis of spinal canal   Herniated nucleus pulposus, C5-6   Pain in left foot   Thoracic radiculopathy   Complex regional pain syndrome type 1 of left lower extremity   Complex regional pain syndrome type 1 of right lower extremity   Hypertriglyceridemia   Other chest pain   Episode of unresponsiveness   Complex regional pain syndrome i of left lower limb   Complex regional pain syndrome type 1 of right upper extremity   Arthralgia of right knee   Isolated cervical dystonia   Pain in right hand   Chronic right shoulder pain   Nonepileptic episode    (CMD)   Rotator cuff tendonitis, right   Pelvic floor dysfunction in female  [2] Past Surgical History: Procedure Laterality Date   COLONOSCOPY  2023   LAPAROSCOPIC CHOLECYSTECTOMY     MEDIAL COLLATERAL LIGAMENT REPAIR, ELBOW Left 2021   NECK SURGERY  01/2023   TONSILECTOMY, ADENOIDECTOMY, BILATERAL MYRINGOTOMY AND TUBES  2020   TUBAL LIGATION Left 2023   TUBAL LIGATION Right 2011  [3] Current Outpatient Medications on File Prior to Visit  Medication Sig Dispense Refill   acetaminophen  (TYLENOL ) 500 mg tablet Take 1 tablet (500 mg total) by mouth every 8 (eight) hours. 270 tablet 3   amitriptyline  (ELAVIL ) 25 mg  tablet Take 1 tablet (25 mg total) by mouth at bedtime. 90 tablet 3   ammonium lactate (LAC-HYDRIN) 12 % lotion Apply topically as needed for dry skin. 225 g 1   clonazePAM (KlonoPIN) 0.5 mg tablet Take 1 tablet daily, as needed, for panic attacks 20 tablet 1   colestipol (COLESTID) 1 gram tablet Take 2 tablets (2 g total) by mouth in the morning and 2 tablets (2 g total) in the evening. Take with meals. 180 tablet 1   cyanocobalamin, vitamin B-12, 1,000 mcg/mL kit Inject 1 mL (1,000 mcg total) into the muscle once a week for 28 days, THEN 1 mL (1,000 mcg total) every 30 (thirty) days. 4 mL 3   desog-e.estradioL/e.estradioL ([place], 28,) 0.15-0.02 mgx21 /0.01 mg x 5 tab tablet Take 1 active pill p.o. daily.  Takes continuously.  Skip placebo pills. 111 tablet 3   dicyclomine  (BENTYL ) 10 mg capsule Take 2 capsules (  20 mg total) by mouth 3 (three) times a day. 540 capsule 3   doxycycline (VIBRAMYCIN) 100 mg capsule Take 1 capsule (100 mg total) by mouth 2 (two) times a day for 10 days. Take with 8 oz water. Do not lie down for at least 30 minutes after. 20 capsule 0   EPINEPHrine  (EPIPEN ) 0.3 mg/0.3 mL injection syringe Inject 0.3 mL (0.3 mg total) into the thigh as needed for anaphylaxis. 1 Pen 2   escitalopram (LEXAPRO) 20 mg tablet Take 1 tab daily 30 tablet 2   etonogestreL (NEXPLANON) 68 mg impl subdermal implant 68 mg by subdermal route once.     famotidine  (PEPCID ) 40 mg tablet Take 1 tablet (40 mg total) by mouth nightly. 90 tablet 3   fenofibrate (LOFIBRA) 160 mg tablet Take 1 tablet (160 mg total) by mouth daily. 90 tablet 3   fluticasone  propion-salmeteroL (Advair Diskus) 500-50 mcg/dose diskus inhaler Inhale 1 puff  in the morning and 1 puff before bedtime. 180 each 3   gabapentin  (NEURONTIN ) 300 mg capsule Take 4 capsules (1,200 mg total) by mouth 3 (three) times a day. 1080 capsule 3   HYDROcodone -acetaminophen  (NORCO) 10-325 mg per tablet Take 1 tablet by mouth 3 (three)  times a day.     hydrOXYzine  (VISTARIL ) 50 mg capsule TAKE 1 CAPSULE BY MOUTH 3 TIMES A DAY 90 capsule 1   ipratropium-albuteroL  (DUO-NEB) 0.5-2.5 mg/3 mL nebulizer solution Take 3 mL by nebulization every 6 (six) hours as needed for wheezing. 90 mL 3   ketamine  HCl (KETAMINE  TOP) Apply topically.     labetaloL  (NORMODYNE ) 300 mg tablet TAKE 1 TABLET BY MOUTH 2 TIMES A DAY. 60 tablet 2   lansoprazole (PREVACID) 30 mg delayed-release capsule Take 1 capsule (30 mg total) by mouth every morning before breakfast. If swallowing the capsule whole produces nausea, try emptying the capsule into liquid and drinking the liquid. 30 capsule 3   lidocaine  (LIDODERM ) 5 % patch Apply 2 patches topically daily. Remove & discard patch within 12 hours or as directed by MD. 180 patch 3   losartan (COZAAR) 25 mg tablet TAKE 1 TABLET (25 MG TOTAL) BY MOUTH DAILY. 90 tablet 0   miconazole (MICOTIN) 2 % cream Apply 1 Application topically 2 (two) times a day. 118 g 0   montelukast  (SINGULAIR ) 10 mg tablet TAKE ONE TABLET BY MOUTH AT BEDTIME 90 tablet 1   mupirocin (BACTROBAN) 2 % ointment Apply to both sides of the nasal septum twice daily for 4 weeks. 22 g 1   naltrexone 4.5 mg cap Take 1 capsule (4.5 mg total) by mouth daily. 90 capsule 3   neomycin-polymyxin-HC (CORTISPORIN) 3.5-10,000-1 mg/mL-unit/mL-% otic suspension Administer 3 drops into left ear 4 (four) times a day. 10 mL 0   nystatin (MYCOSTATIN) 100,000 unit/gram powder Apply topically 2 (two) times a day. 60 g 5   ondansetron  (ZOFRAN ) 4 mg/5 mL soln oral solution Take 10 mL (8 mg total) by mouth 3 (three) times a day. 2700 mL 1   promethazine  (PHENERGAN ) 25 mg tablet Take 1 tablet (25 mg total) by mouth every 6 (six) hours as needed for nausea or vomiting. 20 tablet 1   scopolamine  (TRANSDERM-SCOP) 1 mg over 3 days pt3d patch Apply 1 patch topically every third day. 24 each 3   solifenacin (VESICARE) 10 mg tablet Take 1 tablet (10 mg total)  by mouth daily. Swallow tablet whole; do not crush, chew, or split. 90 tablet 3   suvorexant (Belsomra)  20 mg tab tablet Take 1 tab at night 30 tablet 2   tiZANidine  (ZANAFLEX ) 4 mg capsule Take 2 capsules (8 mg total) by mouth 3 (three) times a day. 540 capsule 3   topiramate  (TOPAMAX ) 50 mg tablet Take 2 tablets (100 mg total) by mouth at bedtime AND 1 tablet (50 mg total) every morning. 270 tablet 3   triamcinolone acetonide (KENALOG) 0.1 % cream Apply topically 2 (two) times a day. 80 g 0   ubrogepant (Ubrelvy) 100 mg tablet Take 1 tablet (100 mg total) by mouth once as needed (REPEAT A 2ND DOSE IN 2 HOURS IF PAIN NOT ABORTED. MAXIMUM 2 TABLETS PER 24 HOURS.). 16 tablet 11   benzonatate  (TESSALON ) 100 mg capsule Take 1-2 capsules (100-200 mg) by mouth every 8 hours as needed for cough (Patient not taking: Reported on 09/27/2024) 30 capsule 0   [DISCONTINUED] galcanezumab-gnlm (Emgality Pen) 120 mg/mL pnij Inject 1 mL (120 mg total) under the skin every 28 days. 1 mL 11   Current Facility-Administered Medications on File Prior to Visit  Medication Dose Route Frequency Provider Last Rate Last Admin   promethazine  (PHENERGAN ) 6.25 mg in sodium chloride  0.9 % 50 mL IVPB  6.25 mg intravenous Q6H PRN Heather  Jenkins Patee, MD   Stopped at 03/30/24 [phone]  [4] Allergies Allergen Reactions   Adhesive Rash    Also allergic to paper tape  Tegaderm & silicone tape ok   Bromelains Diarrhea   Capsicum Oleoresin Hives    Capsicum oleoresin   Cefdinir Anaphylaxis   Haptens - Plastic And Glue Series Rash    SURGICAL GLUE!~!!!!  Requires stitches only  - causes whelps, severe excoriation   Penicillins Anaphylaxis and Hives    Has patient had a PCN reaction causing anaphylaxis, immediate rash, facial/tongue/throat swelling, SOB or lightheadedness with hypotension? no  Has patient had a PCN reaction causing severe rash involving mucus membranes or skin necrosis?  no  Has patient had a PCN  reaction that required hospitilization?no    Has patient had a PCN reaction occurring within the last 10 years?  YES  If all of the above answers are no then may proceed with cephalosporin use.   Baclofen GI Intolerance and Nausea And Vomiting    Other Reaction(s): Dizziness (intolerance)   Doxycycline Diarrhea and Other (See Comments)    experienced hives, tongue swelling, pruritis - self treated with 100 mg benadryl  x 2 and albuterol .   Levetiracetam Swelling   Levofloxacin Other (See Comments)    Other Reaction(s): Other (See Comments)  Muscles froze  Joint pain   Nabumetone GI Intolerance and Nausea And Vomiting   Nifedipine Swelling    Other Reaction(s): Other (See Comments)  Hand tingling  both feet swelled up bad   Oxybutynin Other (See Comments)    Dry mouth / swallowing nails   Venlafaxine  Rash   Buspar    Effexor     Hydromorphone  Nausea And Vomiting    Severe vomiting   Methocarbamol Nausea And Vomiting   Pineapple GI Intolerance, Nausea And Vomiting and Other (See Comments)    pineapple allergenic extract   Skin Glue Other (See Comments)   Surgical Tape Other (See Comments)   Adhesive Tape-Silicones Rash    Medicinal product acting as adhesive (product)   Buspirone Rash  [5] Family History Problem Relation Name Age of Onset   Miscarriages / Stillbirths Mother Lenward        x3   Breast cancer Mother Lenward  Osteoporosis Mother Lenward    Cancer Mother Lenward        Breast cancer   Hypertension Father Rik    Skin cancer Father Rik    Breast cancer Maternal Grandmother     Colon cancer Maternal Grandfather Ed    Heart disease Paternal Grandfather    [6] Social History Socioeconomic History   Marital status: Married  Tobacco Use   Smoking status: Former    Current packs/day: 0.00    Average packs/day: 1 pack/day for 15.0 years (15.0 ttl pk-yrs)    Types: Cigarettes    Quit date: 02/06/2019    Years since  quitting: 5.6    Passive exposure: Never   Smokeless tobacco: Former   Tobacco comments:    History of vaping  Substance and Sexual Activity   Alcohol use: Not Currently   Drug use: Never   Sexual activity: Not Currently    Partners: Male    Birth control/protection: Implant, Female Sterilization, Other    Comment: Having tubal ligation 03/31/22   Social Drivers of Health   Living Situation: Low Risk (09/18/2024)   Living Situation    What is your living situation today?: I have a steady place to live    Think about the place you live. Do you have problems with any of the following? Choose all that apply:: None/None on this list  Food Insecurity: Low Risk (09/18/2024)   Food vital sign    Within the past 12 months, you worried that your food would run out before you got money to buy more: Never true    Within the past 12 months, the food you bought just didn't last and you didn't have money to get more: Never true  Transportation Needs: No Transportation Needs (09/18/2024)   Transportation    In the past 12 months, has lack of reliable transportation kept you from medical appointments, meetings, work or from getting things needed for daily living? : No  Utilities: Low Risk (09/18/2024)   Utilities    In the past 12 months has the electric, gas, oil, or water company threatened to shut off services in your home? : No  Safety: Low Risk (09/27/2024)   Safety    How often does anyone, including family and friends, physically hurt you?: Never    How often does anyone, including family and friends, insult or talk down to you?: Never    How often does anyone, including family and friends, threaten you with harm?: Never    How often does anyone, including family and friends, scream or curse at you?: Never  Alcohol Screening: Not At Risk (03/22/2024)   Received from [facility]   AUDIT-C    Q1: How often do you have a drink containing alcohol?: Never    Q2: How  many drinks containing alcohol do you have on a typical day when you are drinking?: Patient does not drink  Tobacco Use: Medium Risk (09/27/2024)   Patient History    Smoking Tobacco Use: Former    Smokeless Tobacco Use: Former    Passive Exposure: Never  Depression: At Risk (09/27/2024)   PHQ-2    PHQ-2 Score: 3  Financial Resource Strain: Low Risk (11/24/2022)   Received from [facility]   Overall Financial Resource Strain (CARDIA)    Difficulty of Paying Living Expenses: Not hard at all  [7] Patient Active Problem List Diagnosis   Carpal tunnel syndrome of right wrist   Complex regional pain syndrome type  1 of left upper extremity   Depression   Dystonia   Essential hypertension   Neck pain   Gastroesophageal reflux disease without esophagitis   Migraines   History of unilateral salpingectomy   PCOS (polycystic ovarian syndrome)   Other insomnia   Moderate persistent asthma (CMD)   Recurrent major depressive disorder, in partial remission   Positive ANA (antinuclear antibody)   Raynaud's disease   Pain of right lower leg   Seasonal allergic rhinitis due to pollen   Generalized anxiety disorder   Diarrhea   Dysphagia   Endometriosis of left pelvic sidewall   Flank pain   Pilonidal cyst   Positive Lyme disease serology   Syncope   Cramp and spasm   Rectal bleeding   Chronic pain disorder   Cervical stenosis of spinal canal   Herniated nucleus pulposus, C5-6   Pain in left foot   Thoracic radiculopathy   Complex regional pain syndrome type 1 of left lower extremity   Complex regional pain syndrome type 1 of right lower extremity   Hypertriglyceridemia   Other chest pain   Episode of unresponsiveness   Complex regional pain syndrome i of left lower limb   Complex regional pain syndrome type 1 of right upper extremity   Arthralgia of right knee   Isolated cervical dystonia   Pain in right hand   Chronic right  shoulder pain   Nonepileptic episode    (CMD)   Rotator cuff tendonitis, right   Pelvic floor dysfunction in female  "

## 2024-09-30 NOTE — Telephone Encounter (Signed)
 Medication Access Center Summary  PA Status Approved  Medication Emgality 120MG /ML  PA Approval Dates   PA Number 09/29/2024-09/29/2025  [id number]  Provider Donnice Shams  Insurance Company [employer]   Approval letter saved to Media tab

## 2024-10-03 ENCOUNTER — Emergency Department (HOSPITAL_COMMUNITY)
Admission: EM | Admit: 2024-10-03 | Discharge: 2024-10-03 | Disposition: A | Discharge status: Home / Self Care | Source: Ambulatory Visit

## 2024-10-03 ENCOUNTER — Emergency Department (HOSPITAL_COMMUNITY)

## 2024-10-03 DIAGNOSIS — J45909 Unspecified asthma, uncomplicated: Secondary | ICD-10-CM | POA: Diagnosis not present

## 2024-10-03 DIAGNOSIS — I10 Essential (primary) hypertension: Secondary | ICD-10-CM | POA: Insufficient documentation

## 2024-10-03 DIAGNOSIS — R110 Nausea: Secondary | ICD-10-CM | POA: Insufficient documentation

## 2024-10-03 DIAGNOSIS — Z79899 Other long term (current) drug therapy: Secondary | ICD-10-CM | POA: Insufficient documentation

## 2024-10-03 DIAGNOSIS — Z7951 Long term (current) use of inhaled steroids: Secondary | ICD-10-CM | POA: Diagnosis not present

## 2024-10-03 DIAGNOSIS — R1084 Generalized abdominal pain: Secondary | ICD-10-CM | POA: Diagnosis present

## 2024-10-03 LAB — URINALYSIS, ROUTINE W REFLEX MICROSCOPIC
Bacteria, UA: NONE SEEN
Bilirubin Urine: NEGATIVE
Glucose, UA: NEGATIVE mg/dL
Hgb urine dipstick: NEGATIVE
Ketones, ur: NEGATIVE mg/dL
Nitrite: NEGATIVE
Protein, ur: NEGATIVE mg/dL
Specific Gravity, Urine: 1.018 (ref 1.005–1.030)
pH: 6 (ref 5.0–8.0)

## 2024-10-03 LAB — COMPREHENSIVE METABOLIC PANEL WITH GFR
ALT: 19 U/L (ref 0–44)
AST: 24 U/L (ref 15–41)
Albumin: 4.3 g/dL (ref 3.5–5.0)
Alkaline Phosphatase: 55 U/L (ref 38–126)
Anion gap: 11 (ref 5–15)
BUN: 15 mg/dL (ref 6–20)
CO2: 22 mmol/L (ref 22–32)
Calcium: 10.2 mg/dL (ref 8.9–10.3)
Chloride: 106 mmol/L (ref 98–111)
Creatinine, Ser: 1.04 mg/dL — ABNORMAL HIGH (ref 0.44–1.00)
GFR, Estimated: >60 mL/min
Glucose, Bld: 109 mg/dL — ABNORMAL HIGH (ref 70–99)
Potassium: 4.5 mmol/L (ref 3.5–5.1)
Sodium: 139 mmol/L (ref 135–145)
Total Bilirubin: 0.2 mg/dL (ref 0.0–1.2)
Total Protein: 7.1 g/dL (ref 6.5–8.1)

## 2024-10-03 LAB — CBC
HCT: 37.2 % (ref 36.0–46.0)
Hemoglobin: 11.7 g/dL — ABNORMAL LOW (ref 12.0–15.0)
MCH: 26.6 pg (ref 26.0–34.0)
MCHC: 31.5 g/dL (ref 30.0–36.0)
MCV: 84.5 fL (ref 80.0–100.0)
Platelets: 369 K/uL (ref 150–400)
RBC: 4.4 MIL/uL (ref 3.87–5.11)
RDW: 14 % (ref 11.5–15.5)
WBC: 8 K/uL (ref 4.0–10.5)
nRBC: 0 % (ref 0.0–0.2)

## 2024-10-03 LAB — LIPASE, BLOOD: Lipase: 45 U/L (ref 11–51)

## 2024-10-03 LAB — HCG, SERUM, QUALITATIVE: Preg, Serum: NEGATIVE

## 2024-10-03 MED ORDER — IOHEXOL 300 MG/ML  SOLN
100.0000 mL | Freq: Once | INTRAMUSCULAR | Status: AC | PRN
  Administered 2024-10-03: 100 mL via INTRAVENOUS

## 2024-10-03 MED ORDER — HYDROCODONE-ACETAMINOPHEN 5-325 MG PO TABS
2.0000 | ORAL_TABLET | Freq: Once | ORAL | Status: AC
  Administered 2024-10-03: 2 via ORAL
  Filled 2024-10-03: qty 2

## 2024-10-03 NOTE — ED Triage Notes (Signed)
 Patient complains abdominal pain, rates 10/10. Complains of nausea, denies V/D. Patient states pain was nagging on and off over past two weeks but now is constant and worse. No change in BM. Currently on antibiotic for UTI.

## 2024-10-03 NOTE — ED Provider Notes (Signed)
 " [place] EMERGENCY DEPARTMENT AT [facility] Provider Note   CSN: [id number] Arrival date & time: 10/03/24  1543     Patient presents with: No chief complaint on file.   Joanna Zimmerman is a 39 y.o. female.   HPI Presents with abdominal pain.  Abdominal pain increased over the past couple days.  Generalized abdominal pain.  Maybe worse on the left side.  Endorses nausea.  No vomiting.  No diarrhea.  Still passing flatulence and having bowel movements.  Prior history of cholecystectomy.  Tubal ligation.  Ectopic pregnancy.  No fever no chills.  No sick contacts that she is aware of.  Had a sinus infection leading up to this presentation.  Was told maybe she had a UTI as well.  No history of pyelonephritis or nephrolithiasis.  Takes Norco daily for complex pain syndrome    Previous medical history reviewed : Patient followed up with PCP in January 2026.  Upper story complaints that time. PMH of  PCOS, HTN, hypertriglyceridemia, GERD, asthma, anxiety/depression, complex regional pain syndrome    Prior to Admission medications  Medication Sig Start Date End Date Taking? Authorizing Provider  albuterol  (VENTOLIN  HFA) 108 (90 Base) MCG/ACT inhaler Inhale 2 puffs into the lungs every 6 (six) hours as needed for wheezing or shortness of breath.    [provider]  amitriptyline  (ELAVIL ) 10 MG tablet Take 10 mg by mouth at bedtime.    [provider]  clotrimazole -betamethasone  (LOTRISONE ) cream Apply to affected area 2 times daily prn 05/26/23   Mayer, Jodi R, NP  desvenlafaxine (PRISTIQ) 100 MG 24 hr tablet Take 100 mg by mouth daily. 12/20/22 12/20/23  [provider]  dicyclomine  (BENTYL ) 20 MG tablet Take 1 tablet (20 mg total) by mouth 2 (two) times daily as needed for spasms (abdominal cramping). 05/20/22   Mesner, Selinda, MD  EPINEPHrine  0.3 mg/0.3 mL IJ SOAJ injection Inject 0.3 mg into the muscle as needed for anaphylaxis.    [provider]   etonogestrel (NEXPLANON) 68 MG IMPL implant 68 mg by Subdermal route.    [provider]  gabapentin  (NEURONTIN ) 300 MG capsule Take 900 mg by mouth 3 (three) times daily. 05/13/22   [provider]  HYDROcodone -acetaminophen  (NORCO) 10-325 MG tablet Take 1 tablet by mouth every 6 (six) hours. 02/01/23   Louis Shove, MD  hydrOXYzine  (VISTARIL ) 50 MG capsule Take 50 mg by mouth in the morning and at bedtime. 06/14/22   [provider]  ibuprofen  (ADVIL ) 800 MG tablet Take 800 mg by mouth 3 (three) times daily as needed for painful procedure/intervention. 04/08/22   [provider]  ipratropium-albuterol  (DUONEB) 0.5-2.5 (3) MG/3ML SOLN Take 3 mLs by nebulization daily as needed (shortness of breath).    [provider]  labetalol  (NORMODYNE ) 200 MG tablet Take 200 mg by mouth 2 (two) times daily. 06/15/22   [provider]  LORazepam  (ATIVAN ) 0.5 MG tablet Take 0.5 mg by mouth daily as needed for anxiety. 12/27/22   [provider]  montelukast  (SINGULAIR ) 10 MG tablet Take 10 mg by mouth at bedtime.    [provider]  norethindrone -ethinyl estradiol (LOESTRIN) 1-20 MG-MCG tablet Take 1 tablet by mouth daily. 01/21/23   [provider]  omeprazole (PRILOSEC) 40 MG capsule Take 40 mg by mouth daily with breakfast.    [provider]  ondansetron  (ZOFRAN ) 4 MG/5ML solution Take 8 mg by mouth 2 (two) times daily as needed for nausea/vomiting. 06/15/22  [provider]  oseltamivir  (TAMIFLU ) 75 MG capsule Take 1 capsule (75 mg total) by mouth every 12 (twelve) hours. 09/10/23   Roselyn Carlin NOVAK, MD  predniSONE  (STERAPRED UNI-PAK 21 TAB) 10 MG (21) TBPK tablet Take by mouth daily. Take 6 tabs by mouth daily  for 2 days, then 5 tabs for 2 days, then 4 tabs for 2 days, then 3 tabs for 2 days, 2 tabs for 2 days, then 1 tab by mouth daily for 2 days 08/28/23   Teddy Sharper, FNP  promethazine -dextromethorphan  (PROMETHAZINE -DM) 6.25-15 MG/5ML syrup Take 5 mLs by mouth 4 (four) times daily as needed for cough. 08/30/23   Maranda Jamee Jacob, MD  sulfacetamide  (BLEPH -10) 10 % ophthalmic solution Place 1-2 drops into both eyes in the morning, at noon, and at bedtime. 08/28/23   Teddy Sharper, FNP  tiZANidine  (ZANAFLEX ) 4 MG tablet Take 1 tablet (4 mg total) by mouth every 8 (eight) hours as needed for muscle spasms. 02/01/23   Louis Shove, MD  topiramate  (TOPAMAX ) 50 MG tablet Take 100 mg by mouth at bedtime. 05/21/22   [provider]    Allergies: Capsicum, Cefdinir, Levofloxacin, Penicillins, Keppra [levetiracetam], Oxybutynin, Relafen [nabumetone], Tape, Wound dressing adhesive, Baclofen, Bromelains, Buspirone, Dilaudid  [hydromorphone ], Doxycycline, Nifedipine, Pineapple, Robaxin [methocarbamol], and Venlafaxine     Review of Systems  Constitutional:  Negative for chills and fever.  HENT:  Negative for ear pain and sore throat.   Eyes:  Negative for pain and visual disturbance.  Respiratory:  Negative for cough and shortness of breath.   Cardiovascular:  Negative for chest pain and palpitations.  Gastrointestinal:  Negative for abdominal pain and vomiting.  Genitourinary:  Negative for dysuria and hematuria.  Musculoskeletal:  Negative for arthralgias and back pain.  Skin:  Negative for color change and rash.  Neurological:  Negative for seizures and syncope.  All other systems reviewed and are negative.   Updated Vital Signs BP 102/61 (BP Location: Left Arm)   Pulse 80   Temp 98.5 F (36.9 C) (Oral)   Resp 15   SpO2 95%   Physical Exam Vitals and nursing note reviewed.  Constitutional:      General: She is not in acute distress.    Appearance: She is well-developed.  HENT:     Head: Normocephalic and atraumatic.  Eyes:     Conjunctiva/sclera: Conjunctivae normal.  Cardiovascular:     Rate and Rhythm: Normal rate and regular rhythm.     Heart sounds: No murmur  heard. Pulmonary:     Effort: Pulmonary effort is normal. No respiratory distress.     Breath sounds: Normal breath sounds.  Abdominal:     Palpations: Abdomen is soft.     Tenderness: There is no abdominal tenderness.  Musculoskeletal:        General: No swelling.     Cervical back: Neck supple.  Skin:    General: Skin is warm and dry.     Capillary Refill: Capillary refill takes less than 2 seconds.  Neurological:     Mental Status: She is alert.  Psychiatric:        Mood and Affect: Mood normal.     (all labs ordered are listed, but only abnormal results are displayed) Labs Reviewed  COMPREHENSIVE METABOLIC PANEL WITH GFR - Abnormal; Notable for the following components:      Result Value   Glucose, Bld 109 (*)    Creatinine, Ser 1.04 (*)    All other components within normal  limits  CBC - Abnormal; Notable for the following components:   Hemoglobin 11.7 (*)    All other components within normal limits  URINALYSIS, ROUTINE W REFLEX MICROSCOPIC - Abnormal; Notable for the following components:   Leukocytes,Ua SMALL (*)    All other components within normal limits  LIPASE, BLOOD  HCG, SERUM, QUALITATIVE    EKG: None  Radiology: CT ABDOMEN PELVIS W CONTRAST Result Date: 10/03/2024 EXAM: CT ABDOMEN AND PELVIS WITH CONTRAST 10/03/2024 08:13:07 PM TECHNIQUE: CT of the abdomen and pelvis was performed with the administration of 100 mL of iohexol  (OMNIPAQUE ) 300 MG/ML solution. Multiplanar reformatted images are provided for review. Automated exposure control, iterative reconstruction, and/or weight-based adjustment of the mA/kV was utilized to reduce the radiation dose to as low as reasonably achievable. COMPARISON: 12/09/2022 CLINICAL HISTORY: left sided abd pain. rule out nephrolithiasis. Diverticulitis FINDINGS: LOWER CHEST: No acute abnormality. LIVER: Diffuse fatty infiltration of the liver. GALLBLADDER AND BILE DUCTS: Prior cholecystectomy. No biliary ductal dilatation.  SPLEEN: No acute abnormality. PANCREAS: No acute abnormality. ADRENAL GLANDS: No acute abnormality. KIDNEYS, URETERS AND BLADDER: No stones in the kidneys or ureters. No hydronephrosis. No perinephric or periureteral stranding. Urinary bladder is unremarkable. GI AND BOWEL: Stomach demonstrates no acute abnormality. There is no bowel obstruction. Normal appendix. PERITONEUM AND RETROPERITONEUM: No ascites. No free air. VASCULATURE: Aorta is normal in caliber. LYMPH NODES: No lymphadenopathy. REPRODUCTIVE ORGANS: No acute abnormality. BONES AND SOFT TISSUES: No acute osseous abnormality. No focal soft tissue abnormality. IMPRESSION: 1. No acute findings in the abdomen or pelvis. 2. Fatty liver. Electronically signed by: Franky Crease MD 10/03/2024 08:30 PM EST RP Workstation: [id number]     Procedures   Medications Ordered in the ED  HYDROcodone -acetaminophen  (NORCO/VICODIN) 5-325 MG per tablet 2 tablet (2 tablets Oral Given 10/03/24 2015)  iohexol  (OMNIPAQUE ) 300 MG/ML solution 100 mL (100 mLs Intravenous Contrast Given 10/03/24 2001)                                    Medical Decision Making Amount and/or Complexity of Data Reviewed Labs: ordered. Radiology: ordered.  Risk Prescription drug management.     HPI:   Presents with abdominal pain.  Abdominal pain increased over the past couple days.  Generalized abdominal pain.  Maybe worse on the left side.  Endorses nausea.  No vomiting.  No diarrhea.  Still passing flatulence and having bowel movements.  Prior history of cholecystectomy.  Tubal ligation.  Ectopic pregnancy.  No fever no chills.  No sick contacts that she is aware of.  Had a sinus infection leading up to this presentation.  Was told maybe she had a UTI as well.  No history of pyelonephritis or nephrolithiasis.  Takes Norco daily for complex pain syndrome    Previous medical history reviewed : Patient followed up with PCP in January 2026.  Upper story complaints that time. PMH  of  PCOS, HTN, hypertriglyceridemia, GERD, asthma, anxiety/depression, complex regional pain syndrome   MDM:   Upon examination, patient hemodynamically stable. A&O x 3 with GCS 15.  Generalized pain to palpation of the abdomen but soft with no rebound.  Endorsing pain in left side.  Recent colonoscopy.  She states that this was normal.  Will obtain CT scan.  Rule out nephrolithiasis versus pyelonephritis versus diverticulitis versus ileus versus abscess versus colitis.  Obtain laboratory workup as well.  Will give patient her Norco.  Will reassess Reevaluation:   Upon reexamination, patient hemodynamically stable.  Remains A&O x 3 with GCS 15.  CT scan shows hepatic steatosis otherwise no acute pathology.  No evidence of diverticulitis.  No evidence of pyelonephritis.  UA unremarkable.  No UTI.  All of the laboratory workup unremarkable.  No acute explanation.  Patient can follow-up with gastroenterology.  No concerns for GU pathology at this point time.    I have independently interpreted theCT  images and agree with the radiologist finding   Social Determinant of Health: denies drugs/alcohol    Disposition and Follow Up: GI       Final diagnoses:  Generalized abdominal pain    ED Discharge Orders     None          Simon Lavonia SAILOR, MD 10/03/24 2140  "

## 2024-10-03 NOTE — Discharge Instructions (Signed)
 If you have any fever or chills please come back to ED for further evaluation.  Your CT scan was unremarkable for acute pathology that would be the cause of your pain.  Please follow-up with your  gastroenterologist.
